# Patient Record
Sex: Female | Born: 1975
Health system: Southern US, Community
[De-identification: ages and names within clinical notes are randomized; demographics above are authoritative.]

## PROBLEM LIST (undated history)

## (undated) DIAGNOSIS — I10 Essential (primary) hypertension: Secondary | ICD-10-CM

## (undated) HISTORY — DX: Essential (primary) hypertension: I10

## (undated) HISTORY — PX: BACK SURGERY: SHX140

---

## 2005-03-23 ENCOUNTER — Emergency Department (HOSPITAL_COMMUNITY): Admission: EM | Admit: 2005-03-23 | Discharge: 2005-03-23 | Payer: Self-pay | Admitting: Emergency Medicine

## 2010-10-14 ENCOUNTER — Emergency Department (HOSPITAL_COMMUNITY): Admission: EM | Admit: 2010-10-14 | Discharge: 2010-10-14 | Payer: Self-pay | Admitting: Emergency Medicine

## 2011-02-27 LAB — CBC
HCT: 35.5 % — ABNORMAL LOW (ref 36.0–46.0)
MCH: 29.3 pg (ref 26.0–34.0)
MCV: 91.3 fL (ref 78.0–100.0)
RBC: 3.89 MIL/uL (ref 3.87–5.11)
WBC: 5.5 10*3/uL (ref 4.0–10.5)

## 2011-02-27 LAB — DIFFERENTIAL
Eosinophils Relative: 1 % (ref 0–5)
Lymphocytes Relative: 34 % (ref 12–46)
Lymphs Abs: 1.9 10*3/uL (ref 0.7–4.0)
Monocytes Absolute: 0.4 10*3/uL (ref 0.1–1.0)
Monocytes Relative: 8 % (ref 3–12)

## 2011-02-27 LAB — URINALYSIS, ROUTINE W REFLEX MICROSCOPIC
Bilirubin Urine: NEGATIVE
Glucose, UA: NEGATIVE mg/dL
Hgb urine dipstick: NEGATIVE
Protein, ur: NEGATIVE mg/dL
Specific Gravity, Urine: 1.027 (ref 1.005–1.030)

## 2011-02-27 LAB — BASIC METABOLIC PANEL
CO2: 27 mEq/L (ref 19–32)
Chloride: 104 mEq/L (ref 96–112)
GFR calc Af Amer: >60 mL/min (ref >60)
Potassium: 4 mEq/L (ref 3.5–5.1)

## 2011-02-27 LAB — PREGNANCY, URINE: Preg Test, Ur: NEGATIVE

## 2011-08-09 ENCOUNTER — Inpatient Hospital Stay (INDEPENDENT_AMBULATORY_CARE_PROVIDER_SITE_OTHER)
Admission: RE | Admit: 2011-08-09 | Discharge: 2011-08-09 | Disposition: A | Discharge status: Home / Self Care | Payer: Self-pay | Source: Ambulatory Visit | Attending: Emergency Medicine | Admitting: Emergency Medicine

## 2011-08-09 DIAGNOSIS — L03019 Cellulitis of unspecified finger: Secondary | ICD-10-CM

## 2011-08-12 LAB — CULTURE, ROUTINE-ABSCESS

## 2012-03-28 ENCOUNTER — Emergency Department (HOSPITAL_COMMUNITY)
Admission: EM | Admit: 2012-03-28 | Discharge: 2012-03-28 | Disposition: A | Discharge status: Home / Self Care | Payer: Self-pay | Attending: Emergency Medicine | Admitting: Emergency Medicine

## 2012-03-28 ENCOUNTER — Emergency Department (HOSPITAL_COMMUNITY): Payer: Self-pay

## 2012-03-28 DIAGNOSIS — H6690 Otitis media, unspecified, unspecified ear: Secondary | ICD-10-CM | POA: Insufficient documentation

## 2012-03-28 DIAGNOSIS — R51 Headache: Secondary | ICD-10-CM | POA: Insufficient documentation

## 2012-03-28 DIAGNOSIS — J329 Chronic sinusitis, unspecified: Secondary | ICD-10-CM | POA: Insufficient documentation

## 2012-03-28 MED ORDER — AMOXICILLIN-POT CLAVULANATE 875-125 MG PO TABS: 1.0000 | ORAL_TABLET | Freq: Two times a day (BID) | ORAL | Status: AC

## 2012-03-28 NOTE — ED Notes (Signed)
NAD noted at this time

## 2012-03-28 NOTE — ED Notes (Signed)
Patient advises that she has had nasal congestion and a productive cough with green sputum. Headache, Sore throat and a low grad fever for 1 week.

## 2012-03-28 NOTE — Discharge Instructions (Signed)
Otitis Media, Adult  A middle ear infection is an infection in the space behind the eardrum. The medical name for this is "otitis media." It may happen after a common cold. It is caused by a germ that starts growing in that space. You may feel swollen glands in your neck on the side of the ear infection.  HOME CARE INSTRUCTIONS   · Take your medicine as directed until it is gone, even if you feel better after the first few days.  · Only take over-the-counter or prescription medicines for pain, discomfort, or fever as directed by your caregiver.  · Occasional use of a nasal decongestant a couple times per day may help with discomfort and help the eustachian tube to drain better.  Follow up with your caregiver in 10 to 14 days or as directed, to be certain that the infection has cleared. Not keeping the appointment could result in a chronic or permanent injury, pain, hearing loss and disability. If there is any problem keeping the appointment, you must call back to this facility for assistance.  SEEK IMMEDIATE MEDICAL CARE IF:   · You are not getting better in 2 to 3 days.  · You have pain that is not controlled with medication.  · You feel worse instead of better.  · You cannot use the medication as directed.  · You develop swelling, redness or pain around the ear or stiffness in your neck.  MAKE SURE YOU:   · Understand these instructions.  · Will watch your condition.  · Will get help right away if you are not doing well or get worse.  Document Released: 09/06/2004 Document Revised: 11/21/2011 Document Reviewed: 07/08/2008  ExitCare® Patient Information ©2012 [employer].

## 2012-03-28 NOTE — ED Provider Notes (Signed)
History     CSN: [id number]  Arrival date & time 03/28/12  6578   First MD Initiated Contact with Patient 03/28/12 [phone]      Chief Complaint  Patient presents with  . Nasal Congestion  . Sore Throat  . Headache    (Consider location/radiation/quality/duration/timing/severity/associated sxs/prior treatment) HPI Comments: Patient is with chief complaint of upper respiratory type symptoms.  Symptoms began one week ago and include headaches, nasal congestion, productive cough with green sputum, sore throat, and right ear pain.  Symptoms have been gradually worsening.  Patient denies any change in vision, difficulty swallowing, shortness of breath, chest pain, chest tightness, ataxia, disequilibrium, or weakness.  Patient has no other complaints at this time.  Patient is a 36 y.o. female presenting with pharyngitis and headaches. The history is provided by the patient.  Sore Throat Associated symptoms include chills, congestion, coughing, a fever and headaches. Pertinent negatives include no abdominal pain, chest pain, fatigue, myalgias, nausea, numbness, sore throat, vomiting or weakness.  Headache  Associated symptoms include a fever. Pertinent negatives include no palpitations, no shortness of breath, no nausea and no vomiting.    No past medical history on file.  No past surgical history on file.  No family history on file.  History  Substance Use Topics  . Smoking status: Not on file  . Smokeless tobacco: Not on file  . Alcohol Use: Not on file    OB History    No data available      Review of Systems  Constitutional: Positive for fever and chills. Negative for appetite change and fatigue.  HENT: Positive for congestion and rhinorrhea. Negative for hearing loss, ear pain, nosebleeds, sore throat, sneezing, trouble swallowing, neck stiffness, voice change, postnasal drip, sinus pressure, tinnitus and ear discharge.   Eyes: Negative for photophobia and visual disturbance.   Respiratory: Positive for cough. Negative for apnea, choking, chest tightness, shortness of breath, wheezing and stridor.   Cardiovascular: Negative for chest pain, palpitations and leg swelling.  Gastrointestinal: Negative for nausea, vomiting, abdominal pain, diarrhea and constipation.  Genitourinary: Negative for dysuria, urgency and flank pain.  Musculoskeletal: Negative for myalgias.  Neurological: Positive for headaches. Negative for dizziness, seizures, syncope, weakness, light-headedness and numbness.  Psychiatric/Behavioral: Negative for behavioral problems and confusion.  All other systems reviewed and are negative.    Allergies  Review of patient's allergies indicates no known allergies.  Home Medications   Current Outpatient Rx  Name Route Sig Dispense Refill  . CETIRIZINE-PSEUDOEPHEDRINE ER 5-120 MG PO TB12 Oral Take 1 tablet by mouth 2 (two) times daily as needed. For allergies    . IBUPROFEN 200 MG PO TABS Oral Take 600 mg by mouth every 8 (eight) hours as needed. For pain    . ALKA-SELTZER PLS ALLERGY & [facility] PO Oral Take 1 tablet by mouth 2 (two) times daily as needed. For colds/allergies      BP 147/88  Pulse 91  Temp(Src) 98 F (36.7 C) (Oral)  Resp 20  SpO2 100%  Physical Exam  Constitutional: She is oriented to person, place, and time. She appears well-developed and well-nourished. No distress.  HENT:  Head: Normocephalic and atraumatic.  Mouth/Throat: Oropharynx is clear and moist. No oropharyngeal exudate.       ttp over frontal sinus, no tragal tenderness, left ear normal, right ear canal normal but TM bulging. No Mastoid tenderness or erythema. Uvula midline, airway intact.   Eyes: Conjunctivae and EOM are normal. Pupils are equal, round, and  reactive to light. No scleral icterus.  Neck: Normal range of motion. Neck supple. No tracheal deviation present. No thyromegaly present.  Cardiovascular: Normal rate, regular rhythm, normal heart sounds and intact  distal pulses.   Pulmonary/Chest: Effort normal and breath sounds normal. No stridor. No respiratory distress. She has no wheezes.  Abdominal: Soft.  Musculoskeletal: Normal range of motion. She exhibits no edema and no tenderness.  Neurological: She is alert and oriented to person, place, and time. Coordination normal.  Skin: Skin is warm and dry. No rash noted. She is not diaphoretic. No erythema. No pallor.  Psychiatric: She has a normal mood and affect. Her behavior is normal.    ED Course  Procedures (including critical care time)  Labs Reviewed - No data to display No results found.   No diagnosis found.    MDM  Otitis media, right. Etiology likely d/t sinus infection. Will be treated with abx. Radiology reviewed, no evidence of PNA. Verbalizes understanding and is agreeable with plan. Pt is hemodynamically stable & in NAD prior to dc.         Jaci Carrel, [place] 03/28/12 1036

## 2012-03-29 ENCOUNTER — Encounter (HOSPITAL_COMMUNITY): Payer: Self-pay | Admitting: *Deleted

## 2012-03-31 NOTE — ED Provider Notes (Signed)
Medical screening examination/treatment/procedure(s) were performed by non-physician practitioner and as supervising physician I was immediately available for consultation/collaboration.   Cyndra Numbers, MD 03/31/12 [phone]

## 2013-09-04 ENCOUNTER — Emergency Department (HOSPITAL_COMMUNITY)
Admission: EM | Admit: 2013-09-04 | Discharge: 2013-09-04 | Disposition: A | Discharge status: Home / Self Care | Payer: BC Managed Care – PPO | Source: Home / Self Care | Attending: Family Medicine | Admitting: Family Medicine

## 2013-09-04 ENCOUNTER — Encounter (HOSPITAL_COMMUNITY): Payer: Self-pay

## 2013-09-04 DIAGNOSIS — H6122 Impacted cerumen, left ear: Secondary | ICD-10-CM

## 2013-09-04 DIAGNOSIS — H6120 Impacted cerumen, unspecified ear: Secondary | ICD-10-CM

## 2013-09-04 NOTE — ED Notes (Signed)
Left ear pain x 1 week

## 2013-09-04 NOTE — ED Provider Notes (Signed)
CSN: [id number]     Arrival date & time 09/04/13  0906 History   First MD Initiated Contact with Patient 09/04/13 1013     Chief Complaint  Patient presents with  . Otalgia    Left    Patient is a 37 y.o. female presenting with ear pain. The history is provided by the patient.  Otalgia Location:  Left Quality:  Pressure Severity:  Mild Onset quality:  Gradual Duration:  1 week Timing:  Constant Progression:  Worsening Chronicity:  Recurrent Worsened by:  Nothing tried Ineffective treatments:  None tried Pt reports a 1 wk h/o increasing pressure and slight pain to her (L) ear. States her hearing is muffled as well. Denies fever or other URI type sx's. Admits to h/o excessive ear wax production and has had to have her ears irrigated in the past.   History reviewed. No pertinent past medical history. History reviewed. No pertinent past surgical history. No family history on file. History  Substance Use Topics  . Smoking status: Never Smoker   . Smokeless tobacco: Not on file  . Alcohol Use: Yes     Comment: occasionally   OB History   Grav Para Term Preterm Abortions TAB SAB Ect Mult Living                 Review of Systems  HENT: Positive for ear pain.   All other systems reviewed and are negative.    Allergies  Review of patient's allergies indicates no known allergies.  Home Medications   Current Outpatient Rx  Name  Route  Sig  Dispense  Refill  . cetirizine-pseudoephedrine (ZYRTEC-D) 5-120 MG per tablet   Oral   Take 1 tablet by mouth 2 (two) times daily as needed. For allergies         . ibuprofen (ADVIL,MOTRIN) 200 MG tablet   Oral   Take 600 mg by mouth every 8 (eight) hours as needed. For pain          BP 147/87  Pulse 90  Temp(Src) 98.7 F (37.1 C) (Oral)  Resp 18  SpO2 98%  LMP 08/16/2013 Physical Exam  Constitutional: She is oriented to person, place, and time. She appears well-developed and well-nourished.  HENT:  Head: Normocephalic  and atraumatic.  Right Ear: Tympanic membrane, external ear and ear canal normal.  Cerumen impaction (L) ear  Eyes: Conjunctivae are normal.  Neck: Neck supple.  Cardiovascular: Normal rate.   Musculoskeletal: Normal range of motion.  Neurological: She is alert and oriented to person, place, and time.  Skin: Skin is warm and dry.  Psychiatric: She has a normal mood and affect.    ED Course  Procedures (including critical care time) Labs Review Labs Reviewed - No data to display Imaging Review No results found.  MDM  No diagnosis found.  1 week h/o cerumen impaction. Resolved w/ irrigation. Symptoms improved. Strategies for avoiding cerumen impactions discussed.     Leanne Chang, NP 09/04/13 1100

## 2013-09-08 NOTE — ED Provider Notes (Signed)
Medical screening examination/treatment/procedure(s) were performed by resident physician or non-physician practitioner and as supervising physician I was immediately available for consultation/collaboration.   Mischa Brittingham DOUGLAS MD.   Champayne Kocian D Carmela Piechowski, MD 09/08/13 1510 

## 2015-04-04 ENCOUNTER — Other Ambulatory Visit: Payer: Self-pay | Admitting: Physical Medicine and Rehabilitation

## 2015-04-04 DIAGNOSIS — M545 Low back pain: Secondary | ICD-10-CM

## 2015-04-22 ENCOUNTER — Ambulatory Visit
Admission: RE | Admit: 2015-04-22 | Discharge: 2015-04-22 | Disposition: A | Discharge status: Home / Self Care | Payer: BLUE CROSS/BLUE SHIELD | Source: Ambulatory Visit | Attending: Physical Medicine and Rehabilitation | Admitting: Physical Medicine and Rehabilitation

## 2015-04-22 DIAGNOSIS — M545 Low back pain: Secondary | ICD-10-CM

## 2015-05-22 ENCOUNTER — Encounter (HOSPITAL_COMMUNITY): Payer: Self-pay | Admitting: Family Medicine

## 2015-05-22 ENCOUNTER — Emergency Department (HOSPITAL_COMMUNITY)
Admission: EM | Admit: 2015-05-22 | Discharge: 2015-05-22 | Disposition: A | Discharge status: Home / Self Care | Payer: BLUE CROSS/BLUE SHIELD | Attending: Emergency Medicine | Admitting: Emergency Medicine

## 2015-05-22 DIAGNOSIS — H748X1 Other specified disorders of right middle ear and mastoid: Secondary | ICD-10-CM | POA: Insufficient documentation

## 2015-05-22 DIAGNOSIS — H9191 Unspecified hearing loss, right ear: Secondary | ICD-10-CM | POA: Insufficient documentation

## 2015-05-22 DIAGNOSIS — H9201 Otalgia, right ear: Secondary | ICD-10-CM | POA: Diagnosis not present

## 2015-05-22 MED ORDER — ANTIPYRINE-BENZOCAINE 5.4-1.4 % OT SOLN: 3.0000 [drp] | OTIC | Status: DC | PRN

## 2015-05-22 NOTE — Discharge Instructions (Signed)
Otalgia  The most common reason for this in children is an infection of the middle ear. Pain from the middle ear is usually caused by a build-up of fluid and pressure behind the eardrum. Pain from an earache can be sharp, dull, or burning. The pain may be temporary or constant. The middle ear is connected to the nasal passages by a short narrow tube called the Eustachian tube. The Eustachian tube allows fluid to drain out of the middle ear, and helps keep the pressure in your ear equalized.  CAUSES   A cold or allergy can block the Eustachian tube with inflammation and the build-up of secretions. This is especially likely in small children, because their Eustachian tube is shorter and more horizontal. When the Eustachian tube closes, the normal flow of fluid from the middle ear is stopped. Fluid can accumulate and cause stuffiness, pain, hearing loss, and an ear infection if germs start growing in this area.  SYMPTOMS   The symptoms of an ear infection may include fever, ear pain, fussiness, increased crying, and irritability. Many children will have temporary and minor hearing loss during and right after an ear infection. Permanent hearing loss is rare, but the risk increases the more infections a child has. Other causes of ear pain include retained water in the outer ear canal from swimming and bathing.  Ear pain in adults is less likely to be from an ear infection. Ear pain may be referred from other locations. Referred pain may be from the joint between your jaw and the skull. It may also come from a tooth problem or problems in the neck. Other causes of ear pain include:   A foreign body in the ear.   Outer ear infection.   Sinus infections.   Impacted ear wax.   Ear injury.   Arthritis of the jaw or TMJ problems.   Middle ear infection.   Tooth infections.   Sore throat with pain to the ears.  DIAGNOSIS   Your caregiver can usually make the diagnosis by examining you. Sometimes other special studies,  including x-rays and lab work may be necessary.  TREATMENT    If antibiotics were prescribed, use them as directed and finish them even if you or your child's symptoms seem to be improved.   Sometimes PE tubes are needed in children. These are little plastic tubes which are put into the eardrum during a simple surgical procedure. They allow fluid to drain easier and allow the pressure in the middle ear to equalize. This helps relieve the ear pain caused by pressure changes.  HOME CARE INSTRUCTIONS    Only take over-the-counter or prescription medicines for pain, discomfort, or fever as directed by your caregiver. DO NOT GIVE CHILDREN ASPIRIN because of the association of Reye's Syndrome in children taking aspirin.   Use a cold pack applied to the outer ear for 15-20 minutes, 03-04 times per day or as needed may reduce pain. Do not apply ice directly to the skin. You may cause frost bite.   Over-the-counter ear drops used as directed may be effective. Your caregiver may sometimes prescribe ear drops.   Resting in an upright position may help reduce pressure in the middle ear and relieve pain.   Ear pain caused by rapidly descending from high altitudes can be relieved by swallowing or chewing gum. Allowing infants to suck on a bottle during airplane travel can help.   Do not smoke in the house or near children. If you are   unable to quit smoking, smoke outside.   Control allergies.  SEEK IMMEDIATE MEDICAL CARE IF:    You or your child are becoming sicker.   Pain or fever relief is not obtained with medicine.   You or your child's symptoms (pain, fever, or irritability) do not improve within 24 to 48 hours or as instructed.   Severe pain suddenly stops hurting. This may indicate a ruptured eardrum.   You or your children develop new problems such as severe headaches, stiff neck, difficulty swallowing, or swelling of the face or around the ear.  Document Released: 07/19/2004 Document Revised: 02/24/2012  Document Reviewed: 11/23/2008  ExitCare Patient Information 2015 [employer]. This information is not intended to replace advice given to you by your health care provider. Make sure you discuss any questions you have with your health care provider.

## 2015-05-22 NOTE — ED Provider Notes (Signed)
CSN: [id number]     Arrival date & time 05/22/15  1045 History  This chart was scribed for non-physician practitioner, Montine Circle, PA-C working with Quintella Reichert, MD by Rayna Sexton, ED scribe. This patient was seen in room TR11C/TR11C and the patient's care was started at 11:37 AM.    Chief Complaint  Patient presents with  . Otalgia    The history is provided by the patient. No language interpreter was used.    HPI Comments: Amy Lamb is a 39 y.o. female who presents to the Emergency Department complaining of constant, mild, right ear pain with onset on 05/13/2015. Pt notes originally going to Urgent Care for her symptoms and being prescribed amoxicillin and pain medication with no relief of symptoms. Pt also notes taking additional OTC ear and allergy medications with no relief. She notes that the original physician recommended that with no relief of symptoms she should go see an ENT specialist but was unable to get a timely appointment. Pt also complains of intermittent ringing and hearing loss in the affected ear. Pt denies history of DM. She additionally denies fever.     History reviewed. No pertinent past medical history. History reviewed. No pertinent past surgical history. History reviewed. No pertinent family history. History  Substance Use Topics  . Smoking status: Never Smoker   . Smokeless tobacco: Not on file  . Alcohol Use: Yes     Comment: occasionally   OB History    No data available     Review of Systems  Constitutional: Negative for fever.  HENT: Positive for ear pain and hearing loss. Negative for ear discharge.       Allergies  Review of patient's allergies indicates no known allergies.  Home Medications   Prior to Admission medications   Medication Sig Start Date End Date Taking? Authorizing Provider  cetirizine-pseudoephedrine (ZYRTEC-D) 5-120 MG per tablet Take 1 tablet by mouth 2 (two) times daily as needed. For allergies     Historical Provider, MD  ibuprofen (ADVIL,MOTRIN) 200 MG tablet Take 600 mg by mouth every 8 (eight) hours as needed. For pain    Historical Provider, MD   Pulse 78  Temp(Src) 99 F (37.2 C) (Oral)  Resp 18  SpO2 97%  LMP 05/13/2015 Physical Exam  Constitutional: She is oriented to person, place, and time. She appears well-developed and well-nourished. No distress.  HENT:  Head: Normocephalic and atraumatic.  Mouth/Throat: Oropharynx is clear and moist.  Right tympanic membrane is moderately erythematous, with effusion  Eyes: Conjunctivae and EOM are normal. Pupils are equal, round, and reactive to light.  Neck: Normal range of motion. Neck supple. No tracheal deviation present.  Cardiovascular: Normal rate.   Pulmonary/Chest: Breath sounds normal. No respiratory distress.  Abdominal: Soft.  Musculoskeletal: Normal range of motion.  Neurological: She is alert and oriented to person, place, and time.  Skin: Skin is warm and dry.  Psychiatric: She has a normal mood and affect. Her behavior is normal.  Nursing note and vitals reviewed.   ED Course  Procedures  DIAGNOSTIC STUDIES: Oxygen Saturation is 97% on RA, normal by my interpretation.    COORDINATION OF CARE: 11:45 AM Discussed treatment plan with pt at bedside and pt agreed to plan.  Labs Review Labs Reviewed - No data to display  Imaging Review No results found.   EKG Interpretation None      MDM   Final diagnoses:  Otalgia, right    Patient with right-sided otalgia, currently  being treated for otitis media. Presents inquiring about myringotomy. I informed patient of this finding be done by an ENT. Recommend outpatient follow-up. Continue antibiotic as prescribed.  I personally performed the services described in this documentation, which was scribed in my presence. The recorded information has been reviewed and is accurate.     Montine Circle, PA-C 05/22/15 [place], MD 05/22/15 [phone]

## 2015-05-22 NOTE — ED Notes (Signed)
Pt started taking Amox-clav 875-125 mg on 05/13/15. Pt new meds started on Friday 05-19-15. azithromax . Pt stopped the Amox-cla on Friday.

## 2015-05-22 NOTE — ED Notes (Signed)
Pt here for right ear pain. sts she has been taking abx and was referred to ENT but couldn't get appt today. sts severe pain.

## 2016-08-26 DIAGNOSIS — M5416 Radiculopathy, lumbar region: Secondary | ICD-10-CM | POA: Diagnosis not present

## 2017-03-26 DIAGNOSIS — Z79899 Other long term (current) drug therapy: Secondary | ICD-10-CM | POA: Diagnosis not present

## 2017-03-26 DIAGNOSIS — R9431 Abnormal electrocardiogram [ECG] [EKG]: Secondary | ICD-10-CM | POA: Diagnosis not present

## 2017-03-26 DIAGNOSIS — I10 Essential (primary) hypertension: Secondary | ICD-10-CM | POA: Diagnosis not present

## 2017-03-26 DIAGNOSIS — R079 Chest pain, unspecified: Secondary | ICD-10-CM | POA: Diagnosis not present

## 2017-04-09 DIAGNOSIS — I10 Essential (primary) hypertension: Secondary | ICD-10-CM | POA: Diagnosis not present

## 2017-04-09 DIAGNOSIS — R9431 Abnormal electrocardiogram [ECG] [EKG]: Secondary | ICD-10-CM | POA: Diagnosis not present

## 2017-04-09 DIAGNOSIS — R0602 Shortness of breath: Secondary | ICD-10-CM | POA: Diagnosis not present

## 2017-04-09 DIAGNOSIS — R079 Chest pain, unspecified: Secondary | ICD-10-CM | POA: Diagnosis not present

## 2017-05-05 DIAGNOSIS — M5416 Radiculopathy, lumbar region: Secondary | ICD-10-CM | POA: Diagnosis not present

## 2017-10-01 DIAGNOSIS — I10 Essential (primary) hypertension: Secondary | ICD-10-CM | POA: Diagnosis not present

## 2018-01-22 DIAGNOSIS — M5126 Other intervertebral disc displacement, lumbar region: Secondary | ICD-10-CM | POA: Diagnosis not present

## 2018-01-22 DIAGNOSIS — M5416 Radiculopathy, lumbar region: Secondary | ICD-10-CM | POA: Diagnosis not present

## 2018-01-28 ENCOUNTER — Other Ambulatory Visit: Payer: Self-pay | Admitting: Rehabilitation

## 2018-01-28 DIAGNOSIS — M5416 Radiculopathy, lumbar region: Secondary | ICD-10-CM

## 2018-01-31 ENCOUNTER — Ambulatory Visit
Admission: RE | Admit: 2018-01-31 | Discharge: 2018-01-31 | Disposition: A | Discharge status: Home / Self Care | Payer: BLUE CROSS/BLUE SHIELD | Source: Ambulatory Visit | Attending: Rehabilitation | Admitting: Rehabilitation

## 2018-01-31 DIAGNOSIS — M5416 Radiculopathy, lumbar region: Secondary | ICD-10-CM

## 2018-01-31 DIAGNOSIS — M48061 Spinal stenosis, lumbar region without neurogenic claudication: Secondary | ICD-10-CM | POA: Diagnosis not present

## 2018-02-19 DIAGNOSIS — M545 Low back pain: Secondary | ICD-10-CM | POA: Diagnosis not present

## 2018-02-19 DIAGNOSIS — M5126 Other intervertebral disc displacement, lumbar region: Secondary | ICD-10-CM | POA: Diagnosis not present

## 2018-02-19 DIAGNOSIS — M5416 Radiculopathy, lumbar region: Secondary | ICD-10-CM | POA: Diagnosis not present

## 2018-03-05 ENCOUNTER — Encounter: Payer: Self-pay | Admitting: Physician Assistant

## 2018-04-02 DIAGNOSIS — M5416 Radiculopathy, lumbar region: Secondary | ICD-10-CM | POA: Diagnosis not present

## 2018-04-02 DIAGNOSIS — M545 Low back pain: Secondary | ICD-10-CM | POA: Diagnosis not present

## 2018-04-02 DIAGNOSIS — M5126 Other intervertebral disc displacement, lumbar region: Secondary | ICD-10-CM | POA: Diagnosis not present

## 2018-05-15 DIAGNOSIS — M5126 Other intervertebral disc displacement, lumbar region: Secondary | ICD-10-CM | POA: Diagnosis not present

## 2018-05-15 DIAGNOSIS — M545 Low back pain: Secondary | ICD-10-CM | POA: Diagnosis not present

## 2018-06-20 DIAGNOSIS — Z760 Encounter for issue of repeat prescription: Secondary | ICD-10-CM | POA: Diagnosis not present

## 2018-06-20 DIAGNOSIS — I10 Essential (primary) hypertension: Secondary | ICD-10-CM | POA: Diagnosis not present

## 2018-07-08 DIAGNOSIS — M4716 Other spondylosis with myelopathy, lumbar region: Secondary | ICD-10-CM | POA: Diagnosis not present

## 2018-07-08 DIAGNOSIS — M5126 Other intervertebral disc displacement, lumbar region: Secondary | ICD-10-CM | POA: Diagnosis not present

## 2018-07-08 DIAGNOSIS — Z6835 Body mass index (BMI) 35.0-35.9, adult: Secondary | ICD-10-CM | POA: Diagnosis not present

## 2018-07-08 DIAGNOSIS — M545 Low back pain: Secondary | ICD-10-CM | POA: Diagnosis not present

## 2018-08-01 ENCOUNTER — Encounter: Payer: Self-pay | Admitting: Physician Assistant

## 2018-08-01 ENCOUNTER — Ambulatory Visit: Payer: BLUE CROSS/BLUE SHIELD | Admitting: Physician Assistant

## 2018-08-01 ENCOUNTER — Other Ambulatory Visit: Payer: Self-pay

## 2018-08-01 VITALS — BP 114/84 | HR 86 | Temp 99.2°F | Resp 16 | Ht 65.0 in | Wt 206.2 lb

## 2018-08-01 DIAGNOSIS — Z0130 Encounter for examination of blood pressure without abnormal findings: Secondary | ICD-10-CM | POA: Diagnosis not present

## 2018-08-01 DIAGNOSIS — I10 Essential (primary) hypertension: Secondary | ICD-10-CM | POA: Insufficient documentation

## 2018-08-01 DIAGNOSIS — R7303 Prediabetes: Secondary | ICD-10-CM | POA: Insufficient documentation

## 2018-08-01 LAB — POCT URINALYSIS DIP (MANUAL ENTRY)
Bilirubin, UA: NEGATIVE
Blood, UA: NEGATIVE
Glucose, UA: NEGATIVE mg/dL
Ketones, POC UA: NEGATIVE mg/dL
Nitrite, UA: NEGATIVE
Protein Ur, POC: NEGATIVE mg/dL
Spec Grav, UA: 1.02 (ref 1.010–1.025)
Urobilinogen, UA: 1 E.U./dL
pH, UA: 7 (ref 5.0–8.0)

## 2018-08-01 MED ORDER — ENALAPRIL-HYDROCHLOROTHIAZIDE 5-12.5 MG PO TABS: 1.0000 | ORAL_TABLET | Freq: Every day | ORAL | 2 refills | Status: DC

## 2018-08-01 NOTE — Progress Notes (Signed)
Amy Lamb  MRN: [id number] DOB: November 26, 1976  PCP: Patient, No Pcp Per  Subjective:  Pt is a pleasant 42 year old female who  has a past medical history of Hypertension. who presents to clinic for medication refill of enalapril/hydrochlorothiazide 5-12.5 mg.  She is a new patient here. Was receiving care at [facility] x several years, her provider left.  She is feeling well today.  Blood pressure today is 114/84. Took last dose of medication this morning.  Never Smoker Denies shortness of breath, dyspnea on exertion, chest pain, palpitations, headache, lightheadedness  She brings with her today results from her Biometric screening for work 03/05/18: HDL: 71 LDL: 151 Total Cholesterol: 234 Glucose: 81   BUN: 12 Crea: 0.74  A1C: 5.7   TSH: 0.694  Not utd PAP.   Review of Systems  Constitutional: Negative for chills, diaphoresis, fatigue and fever.  Respiratory: Negative for cough, chest tightness and shortness of breath.   Cardiovascular: Negative for chest pain, palpitations and leg swelling.    There are no active problems to display for this patient.   Current Outpatient Medications on File Prior to Visit  Medication Sig Dispense Refill  . celecoxib (CELEBREX) 200 MG capsule Take 200 mg by mouth 2 (two) times daily.    . Enalapril-hydroCHLOROthiazide 5-12.5 MG tablet Take 1 tablet by mouth daily.     No current facility-administered medications on file prior to visit.     No Known Allergies   Objective:  BP 114/84 (BP Location: Left Arm, Patient Position: Sitting, Cuff Size: Large)   Pulse 86   Temp 99.2 F (37.3 C) (Oral)   Resp 16   Ht 5\' 5"  (1.651 m)   Wt 206 lb 3.2 oz (93.5 kg)   LMP 07/14/2018   SpO2 98%   BMI 34.31 kg/m   Physical Exam  Constitutional: She is oriented to person, place, and time. No distress.  Cardiovascular: Normal rate, regular rhythm and normal heart sounds.  Neurological: She is alert and oriented to person, place, and time.   Skin: Skin is warm and dry.  Psychiatric: Judgment normal.  Vitals reviewed.  Results for orders placed or performed in visit on 08/01/18  POCT urinalysis dipstick  Result Value Ref Range   Color, UA yellow yellow   Clarity, UA clear clear   Glucose, UA negative negative mg/dL   Bilirubin, UA negative negative   Ketones, POC UA negative negative mg/dL   Spec Grav, UA 1.020 1.010 - 1.025   Blood, UA negative negative   pH, UA 7.0 5.0 - 8.0   Protein Ur, POC negative negative mg/dL   Urobilinogen, UA 1.0 0.2 or 1.0 E.U./dL   Nitrite, UA Negative Negative   Leukocytes, UA Small (1+) (A) Negative    Assessment and Plan :  1. Essential hypertension -Patient is a pleasant 42 year old female who presents for hypertension.  She is a new patient here.  Blood pressure controlled with enalapril/hydrochlorothiazide 5-12.5 mg, blood pressure today is 114/84.  She is asymptomatic.  Non-smoker.  Recent biometric lab results scanned into her chart.  Return to clinic in 6 months for annual exam including Pap. - Enalapril-hydroCHLOROthiazide 5-12.5 MG tablet; Take 1 tablet by mouth daily.  Dispense: 90 tablet; Refill: 2  3. Prediabetes -Biometric screening labs indicate prediabetes.  Discussed this with patient as well as diabetes prevention lifestyle measures.  She understands.  Will recheck this in 6 months at her annual exam.  Mercer Pod, PA-C  Primary [facility] at [facility]  [facility] 08/01/2018 2:05 PM  Please note: Portions of this report may have been transcribed using dragon voice recognition software. Every effort was made to ensure accuracy; however, inadvertent computerized transcription errors may be present.

## 2018-08-01 NOTE — Patient Instructions (Addendum)
The American Heart Association is a great resource to help you lower cholesterol levels and how to eat a heart healthy diet. Eating heart healthy can lower your cholesterol as well as blood sugar.    I recommend a fish oil/omega-3 supplements daily as well as focusing on low cholesterol diet and exercise. It is important to take an omega-3/fish oil supplement that is highest in the [place] and EPA types of omega-3 so look for those under the active ingredients and pick the one with the most during the next time you purchase supplements. Sometimes the the store brands will actually end up being the best for the least cost. Do not pay attention to the total mg listed on the front of the bottle.  You may also want to consider adding in flax seed, niacin, and/or red yeast rice and coenzyme q10 to help lower your cholesterol.   Come back in 6 months for your annual exam and PAP screening.   Prediabetes Eating Plan Prediabetes-also called impaired glucose tolerance or impaired fasting glucose-is a condition that causes blood sugar (blood glucose) levels to be higher than normal. Following a healthy diet can help to keep prediabetes under control. It can also help to lower the risk of type 2 diabetes and heart disease, which are increased in people who have prediabetes. Along with regular exercise, a healthy diet:  Promotes weight loss.  Helps to control blood sugar levels.  Helps to improve the way that the body uses insulin.  What do I need to know about this eating plan?  Use the glycemic index (GI) to plan your meals. The index tells you how quickly a food will raise your blood sugar. Choose low-GI foods. These foods take a longer time to raise blood sugar.  Pay close attention to the amount of carbohydrates in the food that you eat. Carbohydrates increase blood sugar levels.  Keep track of how many calories you take in. Eating the right amount of calories will help you to achieve a healthy weight.  Losing about 7 percent of your starting weight can help to prevent type 2 diabetes.  You may want to follow a Mediterranean diet. This diet includes a lot of vegetables, lean meats or fish, whole grains, fruits, and healthy oils and fats. What foods can I eat? Grains Whole grains, such as whole-wheat or whole-grain breads, crackers, cereals, and pasta. Unsweetened oatmeal. Bulgur. Barley. Quinoa. Brown rice. Corn or whole-wheat flour tortillas or taco shells. Vegetables Lettuce. Spinach. Peas. Beets. Cauliflower. Cabbage. Broccoli. Carrots. Tomatoes. Squash. Eggplant. Herbs. Peppers. Onions. Cucumbers. Brussels sprouts. Fruits Berries. Bananas. Apples. Oranges. Grapes. Papaya. Mango. Pomegranate. Kiwi. Grapefruit. Cherries. Meats and Other Protein Sources Seafood. Lean meats, such as chicken and [place] or lean cuts of pork and beef. Tofu. Eggs. Nuts. Beans. Dairy Low-fat or fat-free dairy products, such as yogurt, cottage cheese, and cheese. Beverages Water. Tea. Coffee. Sugar-free or diet soda. Seltzer water. Milk. Milk alternatives, such as soy or almond milk. Condiments Mustard. Relish. Low-fat, low-sugar ketchup. Low-fat, low-sugar barbecue sauce. Low-fat or fat-free mayonnaise. Sweets and Desserts Sugar-free or low-fat pudding. Sugar-free or low-fat ice cream and other frozen treats. Fats and Oils Avocado. Walnuts. Olive oil. The items listed above may not be a complete list of recommended foods or beverages. Contact your dietitian for more options. What foods are not recommended? Grains Refined white flour and flour products, such as bread, pasta, snack foods, and cereals. Beverages Sweetened drinks, such as sweet iced tea and soda. Sweets and  Desserts Baked goods, such as cake, cupcakes, pastries, cookies, and cheesecake. The items listed above may not be a complete list of foods and beverages to avoid. Contact your dietitian for more information. This information is not  intended to replace advice given to you by your health care provider. Make sure you discuss any questions you have with your health care provider. Document Released: 04/18/2015 Document Revised: 05/09/2016 Document Reviewed: 12/28/2014 Elsevier Interactive Patient Education  2017 [place] you for coming in today. I hope you feel we met your needs.  Feel free to call PCP if you have any questions or further requests.  Please consider signing up for MyChart if you do not already have it, as this is a great way to communicate with me.  Best,  Amy McVey, PA-C  IF you received an x-ray today, you will receive an invoice from [facility]. Please contact [facility] at [phone] with questions or concerns regarding your invoice.   IF you received labwork today, you will receive an invoice from [place]. Please contact [facility] at [phone] with questions or concerns regarding your invoice.   Our billing staff will not be able to assist you with questions regarding bills from these companies.  You will be contacted with the lab results as soon as they are available. The fastest way to get your results is to activate your My Chart account. Instructions are located on the last page of this paperwork. If you have not heard from [place] regarding the results in 2 weeks, please contact this office.

## 2018-08-21 DIAGNOSIS — Z4689 Encounter for fitting and adjustment of other specified devices: Secondary | ICD-10-CM | POA: Diagnosis not present

## 2018-08-21 DIAGNOSIS — M5126 Other intervertebral disc displacement, lumbar region: Secondary | ICD-10-CM | POA: Diagnosis not present

## 2018-08-27 DIAGNOSIS — R9431 Abnormal electrocardiogram [ECG] [EKG]: Secondary | ICD-10-CM | POA: Diagnosis not present

## 2018-08-27 DIAGNOSIS — Z01818 Encounter for other preprocedural examination: Secondary | ICD-10-CM | POA: Diagnosis not present

## 2018-09-15 DIAGNOSIS — M5126 Other intervertebral disc displacement, lumbar region: Secondary | ICD-10-CM | POA: Diagnosis not present

## 2018-09-15 DIAGNOSIS — R2689 Other abnormalities of gait and mobility: Secondary | ICD-10-CM | POA: Diagnosis not present

## 2018-09-15 DIAGNOSIS — M4186 Other forms of scoliosis, lumbar region: Secondary | ICD-10-CM | POA: Diagnosis not present

## 2018-09-15 DIAGNOSIS — M48062 Spinal stenosis, lumbar region with neurogenic claudication: Secondary | ICD-10-CM | POA: Diagnosis not present

## 2018-09-15 DIAGNOSIS — M532X6 Spinal instabilities, lumbar region: Secondary | ICD-10-CM | POA: Diagnosis not present

## 2018-09-15 DIAGNOSIS — M435X6 Other recurrent vertebral dislocation, lumbar region: Secondary | ICD-10-CM | POA: Diagnosis not present

## 2018-09-15 DIAGNOSIS — M419 Scoliosis, unspecified: Secondary | ICD-10-CM | POA: Diagnosis not present

## 2018-09-15 DIAGNOSIS — I10 Essential (primary) hypertension: Secondary | ICD-10-CM | POA: Diagnosis not present

## 2018-09-15 DIAGNOSIS — M4716 Other spondylosis with myelopathy, lumbar region: Secondary | ICD-10-CM | POA: Diagnosis not present

## 2018-09-15 DIAGNOSIS — M4150 Other secondary scoliosis, site unspecified: Secondary | ICD-10-CM | POA: Diagnosis not present

## 2018-09-15 DIAGNOSIS — Z981 Arthrodesis status: Secondary | ICD-10-CM | POA: Diagnosis not present

## 2018-09-15 DIAGNOSIS — M47896 Other spondylosis, lumbar region: Secondary | ICD-10-CM | POA: Diagnosis not present

## 2018-09-15 HISTORY — PX: SPINAL FUSION: SHX223

## 2018-09-16 DIAGNOSIS — Z981 Arthrodesis status: Secondary | ICD-10-CM | POA: Diagnosis not present

## 2018-09-16 DIAGNOSIS — R2689 Other abnormalities of gait and mobility: Secondary | ICD-10-CM | POA: Diagnosis not present

## 2018-09-17 DIAGNOSIS — M48061 Spinal stenosis, lumbar region without neurogenic claudication: Secondary | ICD-10-CM | POA: Diagnosis not present

## 2018-09-17 DIAGNOSIS — M419 Scoliosis, unspecified: Secondary | ICD-10-CM | POA: Diagnosis not present

## 2018-09-17 DIAGNOSIS — Z4789 Encounter for other orthopedic aftercare: Secondary | ICD-10-CM | POA: Diagnosis not present

## 2018-09-17 DIAGNOSIS — R2689 Other abnormalities of gait and mobility: Secondary | ICD-10-CM | POA: Diagnosis not present

## 2018-09-17 DIAGNOSIS — Z981 Arthrodesis status: Secondary | ICD-10-CM | POA: Diagnosis not present

## 2018-09-21 DIAGNOSIS — Z981 Arthrodesis status: Secondary | ICD-10-CM | POA: Diagnosis not present

## 2018-09-21 DIAGNOSIS — R2689 Other abnormalities of gait and mobility: Secondary | ICD-10-CM | POA: Diagnosis not present

## 2018-09-21 DIAGNOSIS — Z4789 Encounter for other orthopedic aftercare: Secondary | ICD-10-CM | POA: Diagnosis not present

## 2018-09-21 DIAGNOSIS — M419 Scoliosis, unspecified: Secondary | ICD-10-CM | POA: Diagnosis not present

## 2018-09-21 DIAGNOSIS — M48061 Spinal stenosis, lumbar region without neurogenic claudication: Secondary | ICD-10-CM | POA: Diagnosis not present

## 2018-09-23 DIAGNOSIS — Z4789 Encounter for other orthopedic aftercare: Secondary | ICD-10-CM | POA: Diagnosis not present

## 2018-09-23 DIAGNOSIS — M419 Scoliosis, unspecified: Secondary | ICD-10-CM | POA: Diagnosis not present

## 2018-09-23 DIAGNOSIS — R2689 Other abnormalities of gait and mobility: Secondary | ICD-10-CM | POA: Diagnosis not present

## 2018-09-23 DIAGNOSIS — Z981 Arthrodesis status: Secondary | ICD-10-CM | POA: Diagnosis not present

## 2018-09-23 DIAGNOSIS — M48061 Spinal stenosis, lumbar region without neurogenic claudication: Secondary | ICD-10-CM | POA: Diagnosis not present

## 2018-09-28 DIAGNOSIS — Z4789 Encounter for other orthopedic aftercare: Secondary | ICD-10-CM | POA: Diagnosis not present

## 2018-09-28 DIAGNOSIS — M419 Scoliosis, unspecified: Secondary | ICD-10-CM | POA: Diagnosis not present

## 2018-09-28 DIAGNOSIS — Z981 Arthrodesis status: Secondary | ICD-10-CM | POA: Diagnosis not present

## 2018-09-28 DIAGNOSIS — M48061 Spinal stenosis, lumbar region without neurogenic claudication: Secondary | ICD-10-CM | POA: Diagnosis not present

## 2018-09-28 DIAGNOSIS — R2689 Other abnormalities of gait and mobility: Secondary | ICD-10-CM | POA: Diagnosis not present

## 2018-10-06 DIAGNOSIS — M549 Dorsalgia, unspecified: Secondary | ICD-10-CM | POA: Diagnosis not present

## 2018-10-15 DIAGNOSIS — M4326 Fusion of spine, lumbar region: Secondary | ICD-10-CM | POA: Diagnosis not present

## 2018-10-15 DIAGNOSIS — M545 Low back pain: Secondary | ICD-10-CM | POA: Diagnosis not present

## 2018-10-15 DIAGNOSIS — Z981 Arthrodesis status: Secondary | ICD-10-CM | POA: Diagnosis not present

## 2018-12-02 DIAGNOSIS — M545 Low back pain: Secondary | ICD-10-CM | POA: Diagnosis not present

## 2018-12-25 IMAGING — MR MR LUMBAR SPINE W/O CM
5 series · 48 of 48 positions shown · non-contrast
Comparison: MRI 04/22/2015

CLINICAL DATA: Lumbar radiculopathy.  Left leg pain and numbness

EXAM:
MRI LUMBAR SPINE WITHOUT CONTRAST
TECHNIQUE: Multiplanar, multisequence MR imaging of the lumbar spine was
performed. No intravenous contrast was administered.

[Series 3: tirm sag · sagittal · 4.0mm · 0.55mm/px · 6 of 14 slices shown]
[im 1/14]
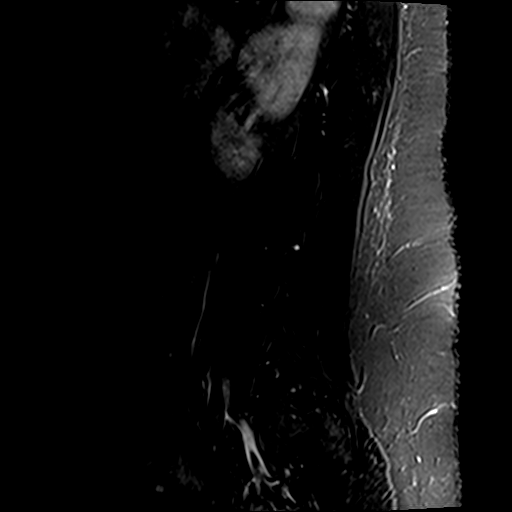
[im 3/14]
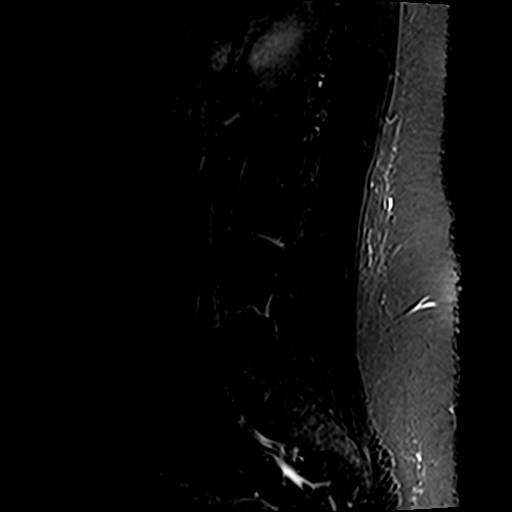
[im 6/14]
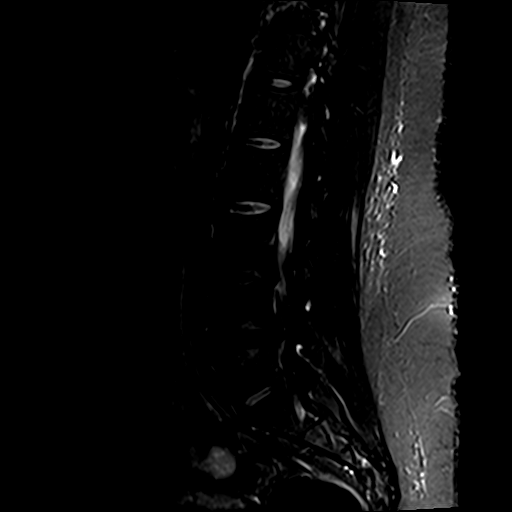
[im 8/14]
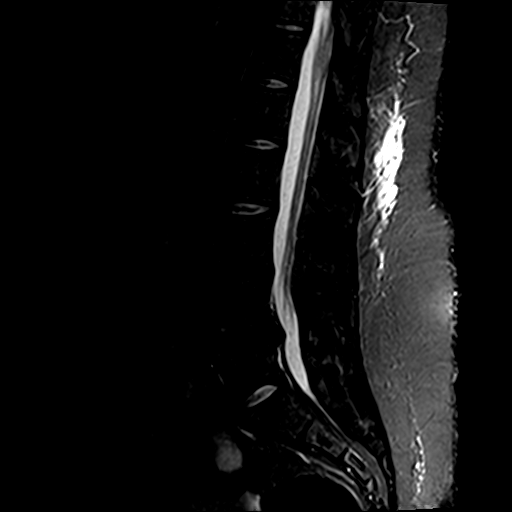
[im 11/14]
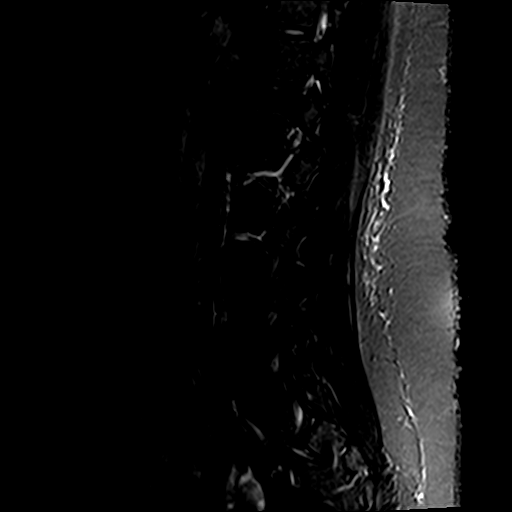
[im 14/14]
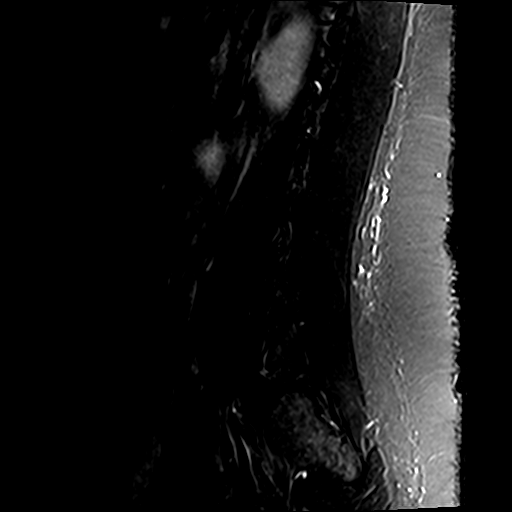

[Series 4: T1 · sagittal · 4.0mm · 1.09mm/px · 6 of 14 slices shown (1 of 2)]
[im 1/14]
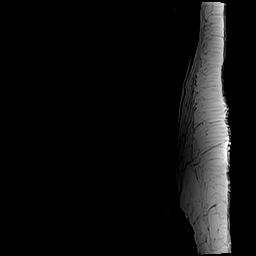
[im 3/14]
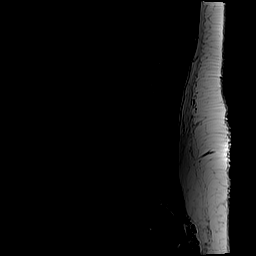
[im 6/14]
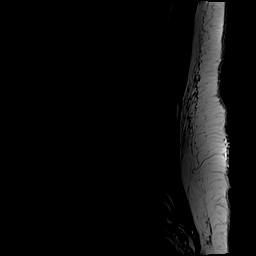
[im 8/14]
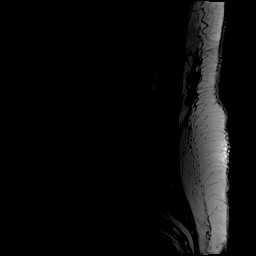
[im 11/14]
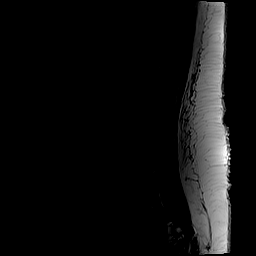
[im 14/14]
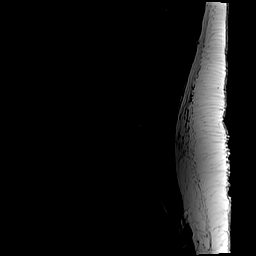

[Series 5: T2 post-contrast · sagittal · 4.0mm · 1.09mm/px · 6 of 14 slices shown]
[im 1/14]
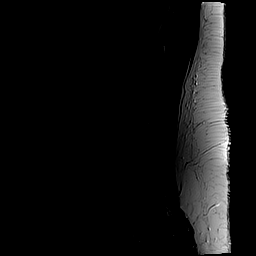
[im 3/14]
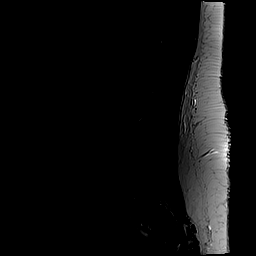
[im 6/14]
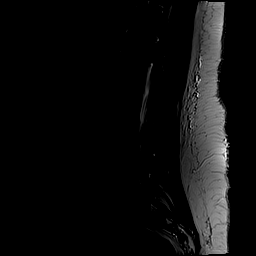
[im 8/14]
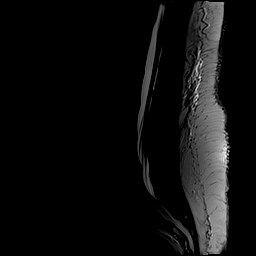
[im 11/14]
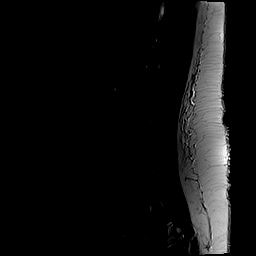
[im 14/14]
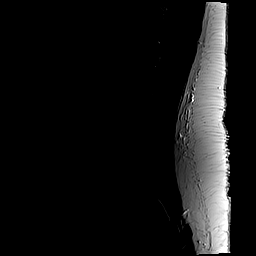

[Series 6: T1 · axial · 4.0mm · 0.78mm/px · z∈[+2,+223]mm · 15 of 36 slices shown (2 of 2)]
[im 1/36]
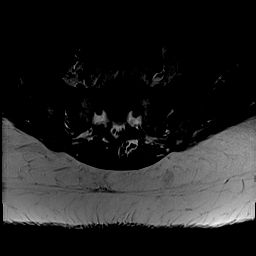
[im 3/36]
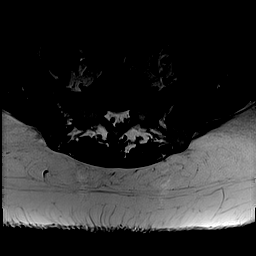
[im 6/36]
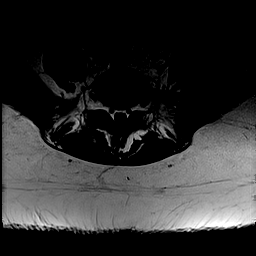
[im 8/36]
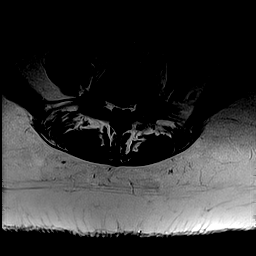
[im 11/36]
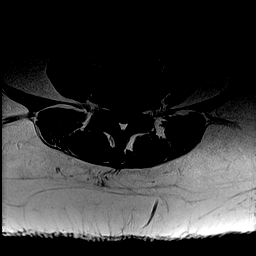
[im 13/36]
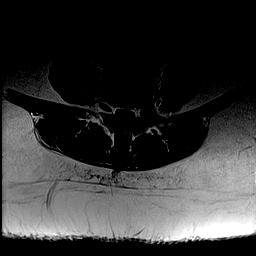
[im 16/36]
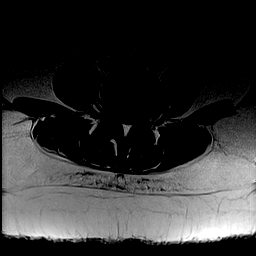
[im 18/36]
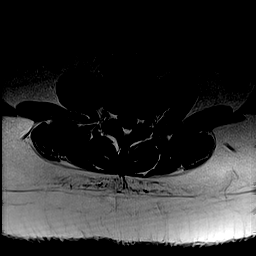
[im 21/36]
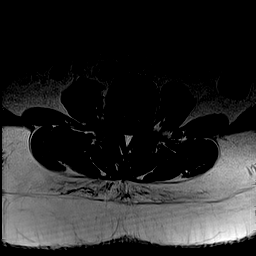
[im 23/36]
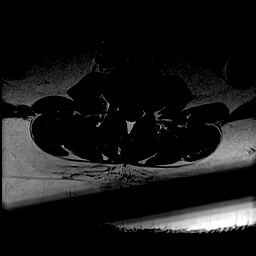
[im 26/36]
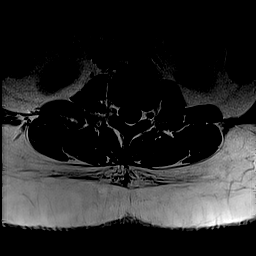
[im 28/36]
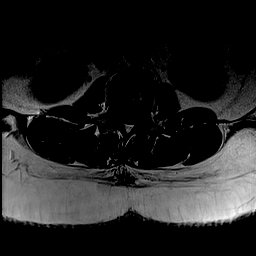
[im 31/36]
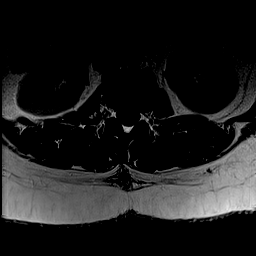
[im 33/36]
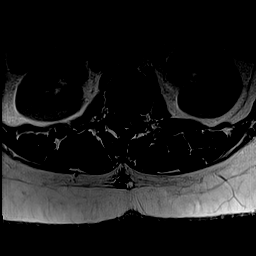
[im 36/36]
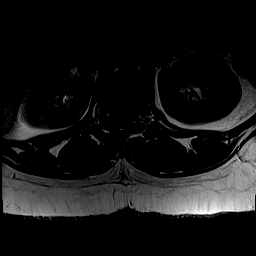

[Series 7: T2 · axial · 4.0mm · 0.78mm/px · z∈[+2,+223]mm · 15 of 36 slices shown]
[im 1/36]
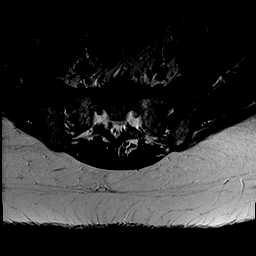
[im 3/36]
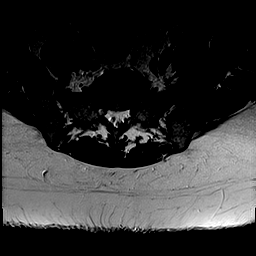
[im 6/36]
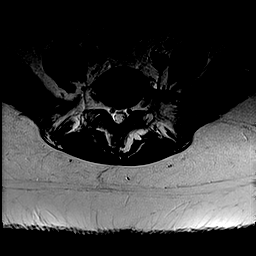
[im 8/36]
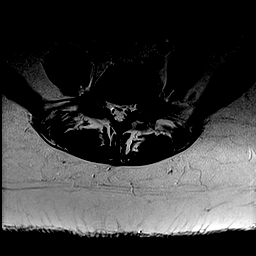
[im 11/36]
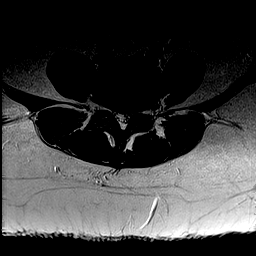
[im 13/36]
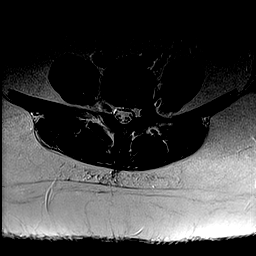
[im 16/36]
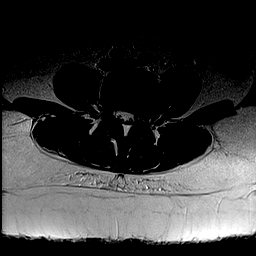
[im 18/36]
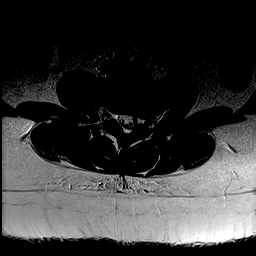
[im 21/36]
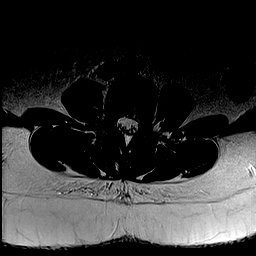
[im 23/36]
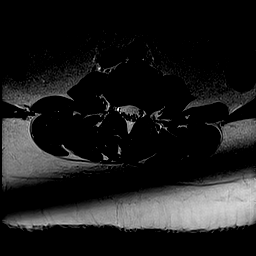
[im 26/36]
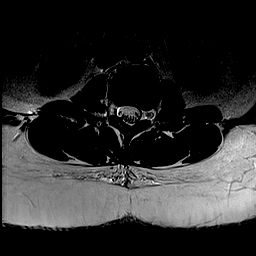
[im 28/36]
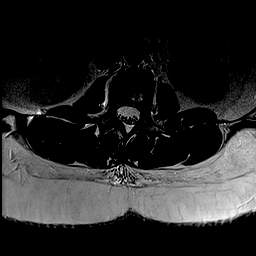
[im 31/36]
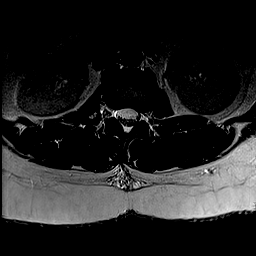
[im 33/36]
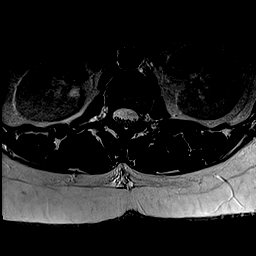
[im 36/36]
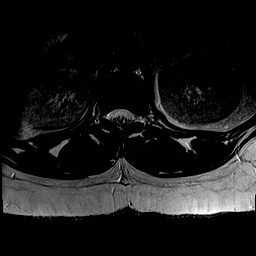

[48 of 48 positions shown; findings below may reference images not displayed]

FINDINGS: Segmentation:  Normal

Alignment:  Normal

Vertebrae: Normal bone marrow. Hemangioma L2 vertebral body
unchanged. Negative for fracture or mass.

Conus medullaris and cauda equina: Conus extends to the T12-L1
level. Conus and cauda equina appear normal.

Paraspinal and other soft tissues: Negative for mass or adenopathy.
Normal retroperitoneum

Disc levels:

L1-2: Negative

L2-3: Small extraforaminal disc protrusion on the left is unchanged.
No significant spinal or foraminal stenosis

L3-4: Disc degeneration and disc bulging. Small extraforaminal disc
protrusion on the left is unchanged. No significant spinal or
foraminal stenosis

L4-5: Moderately large central and left-sided disc protrusion with
slight interval progression. Mild spinal stenosis. Left subarticular
stenosis with impingement of the left L5 nerve root. Osteophyte
extends lateral to the foramen on the left unchanged.

L5-S1: Negative
IMPRESSION: Small extraforaminal disc protrusions on the left at L2-3 and L3-4
appear unchanged.

Central and left-sided disc protrusion L4-5 with slight progression.
Mild spinal stenosis unchanged. Subarticular stenosis with
impingement of the left L5 nerve root.

## 2019-02-02 DIAGNOSIS — M6281 Muscle weakness (generalized): Secondary | ICD-10-CM | POA: Diagnosis not present

## 2019-02-02 DIAGNOSIS — M4326 Fusion of spine, lumbar region: Secondary | ICD-10-CM | POA: Diagnosis not present

## 2019-02-02 DIAGNOSIS — M545 Low back pain: Secondary | ICD-10-CM | POA: Diagnosis not present

## 2019-02-10 DIAGNOSIS — M4326 Fusion of spine, lumbar region: Secondary | ICD-10-CM | POA: Diagnosis not present

## 2019-02-10 DIAGNOSIS — M545 Low back pain: Secondary | ICD-10-CM | POA: Diagnosis not present

## 2019-02-10 DIAGNOSIS — M6281 Muscle weakness (generalized): Secondary | ICD-10-CM | POA: Diagnosis not present

## 2019-02-17 DIAGNOSIS — M545 Low back pain: Secondary | ICD-10-CM | POA: Diagnosis not present

## 2019-02-17 DIAGNOSIS — M6281 Muscle weakness (generalized): Secondary | ICD-10-CM | POA: Diagnosis not present

## 2019-02-17 DIAGNOSIS — M4326 Fusion of spine, lumbar region: Secondary | ICD-10-CM | POA: Diagnosis not present

## 2019-02-26 DIAGNOSIS — M4326 Fusion of spine, lumbar region: Secondary | ICD-10-CM | POA: Diagnosis not present

## 2019-02-26 DIAGNOSIS — M545 Low back pain: Secondary | ICD-10-CM | POA: Diagnosis not present

## 2019-03-03 DIAGNOSIS — M6281 Muscle weakness (generalized): Secondary | ICD-10-CM | POA: Diagnosis not present

## 2019-03-03 DIAGNOSIS — M545 Low back pain: Secondary | ICD-10-CM | POA: Diagnosis not present

## 2019-03-03 DIAGNOSIS — M4326 Fusion of spine, lumbar region: Secondary | ICD-10-CM | POA: Diagnosis not present

## 2019-03-10 DIAGNOSIS — M6281 Muscle weakness (generalized): Secondary | ICD-10-CM | POA: Diagnosis not present

## 2019-03-10 DIAGNOSIS — M545 Low back pain: Secondary | ICD-10-CM | POA: Diagnosis not present

## 2019-03-10 DIAGNOSIS — M4326 Fusion of spine, lumbar region: Secondary | ICD-10-CM | POA: Diagnosis not present

## 2019-03-17 DIAGNOSIS — M4326 Fusion of spine, lumbar region: Secondary | ICD-10-CM | POA: Diagnosis not present

## 2019-03-17 DIAGNOSIS — M545 Low back pain: Secondary | ICD-10-CM | POA: Diagnosis not present

## 2019-03-17 DIAGNOSIS — M6281 Muscle weakness (generalized): Secondary | ICD-10-CM | POA: Diagnosis not present

## 2019-04-09 DIAGNOSIS — M4326 Fusion of spine, lumbar region: Secondary | ICD-10-CM | POA: Diagnosis not present

## 2019-04-09 DIAGNOSIS — Z6834 Body mass index (BMI) 34.0-34.9, adult: Secondary | ICD-10-CM | POA: Diagnosis not present

## 2019-04-09 DIAGNOSIS — I10 Essential (primary) hypertension: Secondary | ICD-10-CM | POA: Diagnosis not present

## 2019-04-09 DIAGNOSIS — M545 Low back pain: Secondary | ICD-10-CM | POA: Diagnosis not present

## 2019-06-08 ENCOUNTER — Telehealth (INDEPENDENT_AMBULATORY_CARE_PROVIDER_SITE_OTHER): Payer: BC Managed Care – PPO | Admitting: Registered Nurse

## 2019-06-08 ENCOUNTER — Encounter: Payer: Self-pay | Admitting: Registered Nurse

## 2019-06-08 DIAGNOSIS — I10 Essential (primary) hypertension: Secondary | ICD-10-CM

## 2019-06-08 MED ORDER — ENALAPRIL-HYDROCHLOROTHIAZIDE 5-12.5 MG PO TABS: 1.0000 | ORAL_TABLET | Freq: Every day | ORAL | 1 refills | Status: DC

## 2019-06-08 NOTE — Progress Notes (Signed)
Telemedicine Encounter- SOAP NOTE Established Patient  This telephone encounter was conducted with the patient's (or proxy's) verbal consent via audio telecommunications: yes  Patient was instructed to have this encounter in a suitably private space; and to only have persons present to whom they give permission to participate. In addition, patient identity was confirmed by use of name plus two identifiers (DOB and address).  I discussed the limitations, risks, security and privacy concerns of performing an evaluation and management service by telephone and the availability of in person appointments. I also discussed with the patient that there may be a patient responsible charge related to this service. The patient expressed understanding and agreed to proceed.  I spent a total of 15 minutes talking with the patient or their proxy.  No chief complaint on file.   Subjective   Amy Lamb is a 43 y.o. established patient. Telephone visit today to establish care and discuss medication refills  HPI Pt has a history significant for HTN and spinal fusion surgery  HTN: pt taking enalapril-HCTZ 5-12.5mg  PO qd with good effect. She states her BP runs 120-138/80s at home. She is not symptomatic for HTN. She has run out of medication for 5 days and reports her BP has not been elevated in that time.  Spinal fusion: 2 herniated discs in 2019, had surgery in October 2019. She states this has had positive effect. She notices some stiffness in her back but overall it is an improvement.  No urgent concerns at this time.  Patient Active Problem List   Diagnosis Date Noted  . Prediabetes 08/01/2018  . Essential hypertension 08/01/2018    Past Medical History:  Diagnosis Date  . Hypertension     Current Outpatient Medications  Medication Sig Dispense Refill  . cyclobenzaprine (FLEXERIL) 5 MG tablet TK 1 T PO QD    . Enalapril-hydroCHLOROthiazide 5-12.5 MG tablet Take 1 tablet by mouth  daily. 90 tablet 1  . meloxicam (MOBIC) 7.5 MG tablet TK 1 T PO BID     No current facility-administered medications for this visit.     No Known Allergies  Social History   Socioeconomic History  . Marital status: Single    Spouse name: Not on file  . Number of children: 0  . Years of education: Not on file  . Highest education level: Not on file  Occupational History  . Not on file  Social Needs  . Financial resource strain: Not hard at all  . Food insecurity    Worry: Never true    Inability: Never true  . Transportation needs    Medical: No    Non-medical: No  Tobacco Use  . Smoking status: Never Smoker  . Smokeless tobacco: Never Used  Substance and Sexual Activity  . Alcohol use: Yes    Comment: occasionally  . Drug use: No  . Sexual activity: Not Currently  Lifestyle  . Physical activity    Days per week: 0 days    Minutes per session: 0 min  . Stress: Not on file  Relationships  . Social connections    Talks on phone: More than three times a week    Gets together: Three times a week    Attends religious service: More than 4 times per year    Active member of club or organization: No    Attends meetings of clubs or organizations: Never    Relationship status: Never married  . Intimate partner violence  Fear of current or ex partner: No    Emotionally abused: No    Physically abused: No    Forced sexual activity: No  Other Topics Concern  . Not on file  Social History Narrative  . Not on file    ROS Per hpi   Objective   Vitals as reported by the patient: There were no vitals filed for this visit.  Diagnoses and all orders for this visit:  Essential hypertension -     Enalapril-hydroCHLOROthiazide 5-12.5 MG tablet; Take 1 tablet by mouth daily.   PLAN:  Pt is due for Annual visit / CPE / pap. She sees her mother daily, given COVID-19, I am giving her a 6 mo supply of medication with the expectation we see her in around that time for  this visit.  HTN is well controlled. Past labs reviewed - due for repeat, will check at upcoming visit.  No further concerns at this time. Pt feels well overall.  Patient encouraged to call clinic with any questions, comments, or concerns.    I discussed the assessment and treatment plan with the patient. The patient was provided an opportunity to ask questions and all were answered. The patient agreed with the plan and demonstrated an understanding of the instructions.   The patient was advised to call back or seek an in-person evaluation if the symptoms worsen or if the condition fails to improve as anticipated.  I provided 15 minutes of non-face-to-face time during this encounter.  Maximiano Coss, NP  Primary Care at [facility]

## 2019-06-08 NOTE — Progress Notes (Signed)
Spoke with pt and she informed me she needs to [facility] for management of medications.  She needs a refill on her hypertension medications. She states there are no other concerns.

## 2019-07-06 ENCOUNTER — Telehealth: Payer: Self-pay | Admitting: General Practice

## 2019-07-06 NOTE — Telephone Encounter (Signed)
Medication Refill - Medication: Enalapril-hydroCHLOROthiazide 5-12.5 MG tablet [[id number]]    Has the patient contacted their pharmacy? Yes.    (Agent: If yes, when and what did the pharmacy advise?) The patient was told that this had been filled until December but she is out of refills already. She needs a new prescription. She is currently out of this medication  Preferred Pharmacy (with phone number or street name):  [facility] [phone] - [place], [place] - [address] AT [address] [phone] (Phone) [phone] (Fax)     Agent: Please be advised that RX refills may take up to 3 business days. We ask that you follow-up with your pharmacy.

## 2019-07-08 DIAGNOSIS — M545 Low back pain: Secondary | ICD-10-CM | POA: Diagnosis not present

## 2019-07-08 DIAGNOSIS — M4326 Fusion of spine, lumbar region: Secondary | ICD-10-CM | POA: Diagnosis not present

## 2019-08-04 ENCOUNTER — Encounter: Payer: Self-pay | Admitting: Family Medicine

## 2019-08-04 ENCOUNTER — Ambulatory Visit: Payer: BC Managed Care – PPO | Admitting: Family Medicine

## 2019-08-04 ENCOUNTER — Other Ambulatory Visit: Payer: Self-pay

## 2019-08-04 VITALS — BP 142/84 | HR 97 | Temp 98.2°F | Resp 16 | Ht 65.0 in | Wt 215.0 lb

## 2019-08-04 DIAGNOSIS — R300 Dysuria: Secondary | ICD-10-CM | POA: Diagnosis not present

## 2019-08-04 DIAGNOSIS — R350 Frequency of micturition: Secondary | ICD-10-CM

## 2019-08-04 DIAGNOSIS — N3090 Cystitis, unspecified without hematuria: Secondary | ICD-10-CM

## 2019-08-04 LAB — POC MICROSCOPIC URINALYSIS (UMFC): Mucus: ABSENT

## 2019-08-04 LAB — POCT URINALYSIS DIP (MANUAL ENTRY)
Bilirubin, UA: NEGATIVE
Glucose, UA: NEGATIVE mg/dL
Ketones, POC UA: NEGATIVE mg/dL
Nitrite, UA: NEGATIVE
Protein Ur, POC: NEGATIVE mg/dL
Spec Grav, UA: 1.025 (ref 1.010–1.025)
Urobilinogen, UA: 0.2 E.U./dL
pH, UA: 6.5 (ref 5.0–8.0)

## 2019-08-04 MED ORDER — NITROFURANTOIN MONOHYD MACRO 100 MG PO CAPS
100.0000 mg | ORAL_CAPSULE | Freq: Two times a day (BID) | ORAL | 1 refills | Status: DC
Start: 2019-08-04 — End: 2020-05-05

## 2019-08-04 NOTE — Patient Instructions (Addendum)
  Drink plenty of fluids.  Take Macrobid (nitrofurantoin) 1 twice daily  If you should get worse such as fever and chills or worsening urinary symptoms come back for a recheck.  Culture is pending.  Take some over-the-counter Azo if desired to relieve the symptoms for a day or 2 while the antibiotics are beginning to take effect.  Return if worse  If you have lab work done today you will be contacted with your lab results within the next 2 weeks.  If you have not heard from [place] then please contact us. The fastest way to get your results is to register for My Chart.   IF you received an x-ray today, you will receive an invoice from [facility]. Please contact [facility] at [phone] with questions or concerns regarding your invoice.   IF you received labwork today, you will receive an invoice from [place]. Please contact [facility] at [phone] with questions or concerns regarding your invoice.   Our billing staff will not be able to assist you with questions regarding bills from these companies.  You will be contacted with the lab results as soon as they are available. The fastest way to get your results is to activate your My Chart account. Instructions are located on the last page of this paperwork. If you have not heard from [place] regarding the results in 2 weeks, please contact this office.

## 2019-08-04 NOTE — Progress Notes (Signed)
Patient ID: Amy Lamb, female    DOB: [birth year]  Age: 43 y.o. MRN: [id number]  Chief Complaint  Patient presents with  . Urinary Frequency    pt states after she finish urinating she feels pressure in her lower stomach. x 1 wk    Subjective:   43 year old lady who comes in here with history of having had some urinary frequency over the last couple of weeks worse.  She has more problems at night.  She is not passed any blood but she does have frequency.  She is sexually involved with a regular partner but this was coming on before her last sexual encounter.  She has not had problems with UTIs in the past.  She is currently working from home and she tries her best to avoid risk of COVID-19.  Current allergies, medications, problem list, past/family and social histories reviewed.  Objective:  BP (!) 142/84   Pulse 97   Temp 98.2 F (36.8 C) (Oral)   Resp 16   Ht 5\' 5"  (1.651 m)   Wt 215 lb (97.5 kg)   LMP 07/16/2019   SpO2 99%   BMI 35.78 kg/m   No major acute distress.  I failed to mention above that she does have a history of prediabetes but is not on any medicines for that, though she does take enalapril hydrochlorothiazide for her blood pressure.  No CVA tenderness.  Abdomen soft without mass or tenderness.  Urinalysis has many WBCs insistent with a UTI.  Assessment & Plan:   Assessment: 1. Cystitis   2. Frequent urination   3. Dysuria       Plan: See instructions  Orders Placed This Encounter  Procedures  . Urine Culture  . POCT urinalysis dipstick  . POCT Microscopic Urinalysis ([facility])    Meds ordered this encounter  Medications  . nitrofurantoin, macrocrystal-monohydrate, (MACROBID) 100 MG capsule    Sig: Take 1 capsule (100 mg total) by mouth 2 (two) times daily.    Dispense:  14 capsule    Refill:  1         Patient Instructions    Drink plenty of fluids.  Take Macrobid (nitrofurantoin) 1 twice daily  If you should get worse such as  fever and chills or worsening urinary symptoms come back for a recheck.  Culture is pending.  Take some over-the-counter Azo if desired to relieve the symptoms for a day or 2 while the antibiotics are beginning to take effect.  Return if worse  If you have lab work done today you will be contacted with your lab results within the next 2 weeks.  If you have not heard from [place] then please contact us. The fastest way to get your results is to register for My Chart.   IF you received an x-ray today, you will receive an invoice from [facility]. Please contact [facility] at [phone] with questions or concerns regarding your invoice.   IF you received labwork today, you will receive an invoice from [place]. Please contact [facility] at [phone] with questions or concerns regarding your invoice.   Our billing staff will not be able to assist you with questions regarding bills from these companies.  You will be contacted with the lab results as soon as they are available. The fastest way to get your results is to activate your My Chart account. Instructions are located on the last page of this paperwork. If you have not heard from [place] regarding the results in  2 weeks, please contact this office.         Return if symptoms worsen or fail to improve.   Ruben Reason, MD 08/04/2019

## 2019-08-05 LAB — URINE CULTURE

## 2019-08-10 ENCOUNTER — Encounter: Payer: Self-pay | Admitting: Radiology

## 2019-08-13 ENCOUNTER — Other Ambulatory Visit: Payer: Self-pay | Admitting: Registered Nurse

## 2019-08-13 DIAGNOSIS — I10 Essential (primary) hypertension: Secondary | ICD-10-CM

## 2019-08-13 NOTE — Telephone Encounter (Signed)
Medication Refill - Medication: Enalapril-hydroCHLOROthiazide 5-12.5 MG tablet  Has the patient contacted their pharmacy? Yes - states that at last [place] said he would approve this for 6 months but she has to call in every month.  She wants to see if that can be changed (Agent: If no, request that the patient contact the pharmacy for the refill.) (Agent: If yes, when and what did the pharmacy advise?)  Preferred Pharmacy (with phone number or street name):  [facility] [phone] - [place], [place] - [address] AT [address] [phone] (Phone) [phone] (Fax)   Agent: Please be advised that RX refills may take up to 3 business days. We ask that you follow-up with your pharmacy.

## 2019-08-13 NOTE — Telephone Encounter (Signed)
Requested medication (s) are due for refill today: yes  Requested medication (s) are on the active medication list: yes  Last refill: 05/2019  Future visit scheduled: no  Notes to clinic:  Review for refill  Requested Prescriptions  Pending Prescriptions Disp Refills   Enalapril-hydroCHLOROthiazide 5-12.5 MG tablet 90 tablet 1    Sig: Take 1 tablet by mouth daily.     Cardiovascular:  ACEI + Diuretic Combos Failed - 08/13/2019 12:06 PM      Failed - Na in normal range and within 180 days    Sodium  Date Value Ref Range Status  10/14/2010 136 135 - 145 mEq/L Final         Failed - K in normal range and within 180 days    Potassium  Date Value Ref Range Status  10/14/2010 4.0 3.5 - 5.1 mEq/L Final         Failed - Cr in normal range and within 180 days    Creatinine, Ser  Date Value Ref Range Status  10/14/2010 0.52 0.4 - 1.2 mg/dL Final         Failed - Ca in normal range and within 180 days    Calcium  Date Value Ref Range Status  10/14/2010 9.3 8.4 - 10.5 mg/dL Final         Failed - Last BP in normal range    BP Readings from Last 1 Encounters:  08/04/19 (!) 142/84         Passed - Patient is not pregnant      Passed - Valid encounter within last 6 months    Recent Outpatient Visits          1 week ago Cystitis   Primary Care at [facility], Fenton Malling, MD   1 year ago Essential hypertension   Primary Care at [facility], [place], [place]

## 2019-08-16 MED ORDER — ENALAPRIL-HYDROCHLOROTHIAZIDE 5-12.5 MG PO TABS: 1.0000 | ORAL_TABLET | Freq: Every day | ORAL | 1 refills | Status: DC

## 2019-10-19 DIAGNOSIS — M5416 Radiculopathy, lumbar region: Secondary | ICD-10-CM | POA: Diagnosis not present

## 2019-10-19 DIAGNOSIS — M4326 Fusion of spine, lumbar region: Secondary | ICD-10-CM | POA: Diagnosis not present

## 2019-10-19 DIAGNOSIS — M545 Low back pain: Secondary | ICD-10-CM | POA: Diagnosis not present

## 2020-01-18 ENCOUNTER — Other Ambulatory Visit: Payer: Self-pay | Admitting: General Practice

## 2020-01-18 DIAGNOSIS — I10 Essential (primary) hypertension: Secondary | ICD-10-CM

## 2020-01-18 MED ORDER — ENALAPRIL-HYDROCHLOROTHIAZIDE 5-12.5 MG PO TABS: 1.0000 | ORAL_TABLET | Freq: Every day | ORAL | 0 refills | Status: DC

## 2020-01-18 NOTE — Telephone Encounter (Signed)
Requested Prescriptions  Pending Prescriptions Disp Refills  . Enalapril-hydroCHLOROthiazide 5-12.5 MG tablet 90 tablet 0    Sig: Take 1 tablet by mouth daily.     Cardiovascular:  ACEI + Diuretic Combos Failed - 01/18/2020  3:27 PM      Failed - Na in normal range and within 180 days    Sodium  Date Value Ref Range Status  10/14/2010 136 135 - 145 mEq/L Final         Failed - K in normal range and within 180 days    Potassium  Date Value Ref Range Status  10/14/2010 4.0 3.5 - 5.1 mEq/L Final         Failed - Cr in normal range and within 180 days    Creatinine, Ser  Date Value Ref Range Status  10/14/2010 0.52 0.4 - 1.2 mg/dL Final         Failed - Ca in normal range and within 180 days    Calcium  Date Value Ref Range Status  10/14/2010 9.3 8.4 - 10.5 mg/dL Final         Failed - Last BP in normal range    BP Readings from Last 1 Encounters:  08/04/19 (!) 142/84         Passed - Patient is not pregnant      Passed - Valid encounter within last 6 months    Recent Outpatient Visits          5 months ago Cystitis   Primary Care at [place], Fenton Malling, MD   7 months ago Essential hypertension   Primary Care at [facility], Delfino Lovett, NP   1 year ago Essential hypertension   Primary Care at [facility], [place], [place]

## 2020-01-18 NOTE — Telephone Encounter (Signed)
Medication Refill - Medication:  Enalapril-hydroCHLOROthiazide 5-12.5 MG tablet   Has the patient contacted their pharmacy? Yes advised to call office.  Preferred Pharmacy (with phone number or street name):  [facility] [phone] - [place], [place] AT [address] Phone:  [phone]  Fax:  [phone]     Agent: Please be advised that RX refills may take up to 3 business days. We ask that you follow-up with your pharmacy.

## 2020-03-16 ENCOUNTER — Ambulatory Visit: Payer: Self-pay | Attending: Internal Medicine

## 2020-03-16 DIAGNOSIS — Z23 Encounter for immunization: Secondary | ICD-10-CM

## 2020-04-03 ENCOUNTER — Encounter (HOSPITAL_COMMUNITY): Payer: Self-pay

## 2020-04-03 ENCOUNTER — Ambulatory Visit (HOSPITAL_COMMUNITY)
Admission: EM | Admit: 2020-04-03 | Discharge: 2020-04-03 | Disposition: A | Discharge status: Home / Self Care | Payer: BC Managed Care – PPO | Attending: Physician Assistant | Admitting: Physician Assistant

## 2020-04-03 ENCOUNTER — Other Ambulatory Visit: Payer: Self-pay

## 2020-04-03 DIAGNOSIS — H9201 Otalgia, right ear: Secondary | ICD-10-CM

## 2020-04-03 DIAGNOSIS — H6121 Impacted cerumen, right ear: Secondary | ICD-10-CM

## 2020-04-03 MED ORDER — FLUTICASONE PROPIONATE 50 MCG/ACT NA SUSP: 1.0000 | Freq: Every day | NASAL | 0 refills | Status: DC

## 2020-04-03 MED ORDER — CETIRIZINE HCL 10 MG PO TABS: 10.0000 mg | ORAL_TABLET | Freq: Every day | ORAL | 0 refills | Status: DC

## 2020-04-03 NOTE — ED Triage Notes (Signed)
Pt states she has right ear pain x 2 weeks.

## 2020-04-03 NOTE — ED Provider Notes (Signed)
[place]    CSN: [id number] Arrival date & time: 04/03/20  1513      History   Chief Complaint Chief Complaint  Patient presents with  . Otalgia    HPI Amy Lamb is a 44 y.o. female.   Patient presents for right-sided ear pain and concern of ear being clogged.  She reports history of having issues with a lot of earwax.  She reports around 2 weeks ago she used peroxide/Debrox solution and had some relief.  However since then she has had a large accumulation of which she believes is earwax and diminished hearing in the right ear.  She has had some ringing in the right ear.  Intermittent pain is reported as well.  Denies fever or chills.  Does not hurt to move the ear.  Pain does not radiate into the jaw or down the neck.   She reports a history of having fluid in her ears that had tubes placed 3 years ago.  She does report some issues with allergies over the years and is not currently on an allergy regiment.     Past Medical History:  Diagnosis Date  . Hypertension     Patient Active Problem List   Diagnosis Date Noted  . Prediabetes 08/01/2018  . Essential hypertension 08/01/2018    Past Surgical History:  Procedure Laterality Date  . SPINAL FUSION  09/2018    OB History   No obstetric history on file.      Home Medications    Prior to Admission medications   Medication Sig Start Date End Date Taking? Authorizing Provider  cetirizine (ZYRTEC ALLERGY) 10 MG tablet Take 1 tablet (10 mg total) by mouth daily. 04/03/20   Lagena Strand, Marguerita Beards, PA-C  cyclobenzaprine (FLEXERIL) 5 MG tablet TK 1 T PO QD 04/09/19   [provider]  Enalapril-hydroCHLOROthiazide 5-12.5 MG tablet Take 1 tablet by mouth daily. 01/18/20   Cannady, Henrine Screws T, NP  fluticasone (FLONASE) 50 MCG/ACT nasal spray Place 1 spray into both nostrils daily. 04/03/20   Irvin Bastin, Marguerita Beards, PA-C  meloxicam (MOBIC) 7.5 MG tablet TK 1 T PO BID 04/11/19   [provider]  nitrofurantoin,  macrocrystal-monohydrate, (MACROBID) 100 MG capsule Take 1 capsule (100 mg total) by mouth 2 (two) times daily. 08/04/19   Posey Boyer, MD    Family History Family History  Problem Relation Age of Onset  . Diabetes Mother   . Hyperlipidemia Mother   . Hypertension Mother   . Heart disease Maternal Grandmother   . Hyperlipidemia Maternal Grandmother   . Hypertension Maternal Grandmother     Social History Social History   Tobacco Use  . Smoking status: Never Smoker  . Smokeless tobacco: Never Used  Substance Use Topics  . Alcohol use: Yes    Comment: occasionally  . Drug use: No     Allergies   Patient has no known allergies.   Review of Systems Review of Systems   Physical Exam Triage Vital Signs ED Triage Vitals  Enc Vitals Group     BP 04/03/20 1605 140/76     Pulse Rate 04/03/20 1605 89     Resp 04/03/20 1605 18     Temp 04/03/20 1605 98.4 F (36.9 C)     Temp Source 04/03/20 1605 Oral     SpO2 04/03/20 1605 100 %     Weight 04/03/20 1604 214 lb (97.1 kg)     Height --  Head Circumference --      Peak Flow --      Pain Score 04/03/20 1604 6     Pain Loc --      Pain Edu? --      Excl. in [place]? --    No data found.  Updated Vital Signs BP 140/76 (BP Location: Right Arm)   Pulse 89   Temp 98.4 F (36.9 C) (Oral)   Resp 18   Wt 214 lb (97.1 kg)   LMP 03/21/2020   SpO2 100%   BMI 35.61 kg/m   Visual Acuity Right Eye Distance:   Left Eye Distance:   Bilateral Distance:    Right Eye Near:   Left Eye Near:    Bilateral Near:     Physical Exam Vitals and nursing note reviewed.  Constitutional:      General: She is not in acute distress.    Appearance: She is well-developed. She is not ill-appearing.  HENT:     Head: Normocephalic and atraumatic.     Ears:     Comments: Patient with impacted cerumen on initial exam of the right ear.  Able to irrigate and remove large amount of impacted cerumen.  Tympanostomy tube was retained in  impacted cerumen. Post removal- TM grey and completely intact. Good landmarks with cone of light. Minimal serous effusion  Left TM with small serous effusion. Otherwise pearly grey with good landmarks Eyes:     Conjunctiva/sclera: Conjunctivae normal.  Cardiovascular:     Rate and Rhythm: Normal rate.     Heart sounds: No murmur.  Pulmonary:     Effort: Pulmonary effort is normal. No respiratory distress.  Musculoskeletal:     Cervical back: Neck supple.  Skin:    General: Skin is warm and dry.  Neurological:     Mental Status: She is alert.      UC Treatments / Results  Labs (all labs ordered are listed, but only abnormal results are displayed) Labs Reviewed - No data to display  EKG   Radiology No results found.  Procedures Ear Cerumen Removal  Date/Time: 04/03/2020 6:00 PM Performed by: Purnell Shoemaker, PA-C Authorized by: Purnell Shoemaker, PA-C   Consent:    Consent obtained:  Verbal   Consent given by:  Patient   Risks discussed:  Bleeding, dizziness, incomplete removal and TM perforation   Alternatives discussed:  Alternative treatment Procedure details:    Location:  R ear   Procedure type: irrigation   Post-procedure details:    Inspection:  TM intact   Hearing quality:  Normal   Patient tolerance of procedure:  Tolerated well, no immediate complications   (including critical care time)  Medications Ordered in UC Medications - No data to display  Initial Impression / Assessment and Plan / UC Course  I have reviewed the triage vital signs and the nursing notes.  Pertinent labs & imaging results that were available during my care of the patient were reviewed by me and considered in my medical decision making (see chart for details).     #Impacted cerumen #Right ear pain Patient is a 44 year old presenting with impacted cerumen in the right ear.  Successful removal.  Believe pain was related to serous effusion with severe impaction.  Tympanostomy tube  in impacted cerumen however TM is intact without evidence of perforation.  Patient had normalization of her hearing.  Recommended patient start Flonase and Zyrtec.  Patient to follow-up with primary care for further management of  ear complaints. Final Clinical Impressions(s) / UC Diagnoses   Final diagnoses:  Impacted cerumen of right ear  Right ear pain     Discharge Instructions     Start the Flonase and Zyrtec daily  Limit the amount of Q-tip in cleaning of your ears.  If you begin to feel clogging use Debrox in that ear.  Follow-up with your primary care for further management if persistent issues with hearing.    ED Prescriptions    Medication Sig Dispense Auth. Provider   fluticasone (FLONASE) 50 MCG/ACT nasal spray Place 1 spray into both nostrils daily. 11.1 mL Hajime Asfaw, Marguerita Beards, PA-C   cetirizine (ZYRTEC ALLERGY) 10 MG tablet Take 1 tablet (10 mg total) by mouth daily. 30 tablet Donye Campanelli, Marguerita Beards, PA-C     PDMP not reviewed this encounter.   Purnell Shoemaker, PA-C 04/03/20 Johnnye Lana

## 2020-04-03 NOTE — Discharge Instructions (Signed)
Start the Flonase and Zyrtec daily  Limit the amount of Q-tip in cleaning of your ears.  If you begin to feel clogging use Debrox in that ear.  Follow-up with your primary care for further management if persistent issues with hearing.

## 2020-04-04 ENCOUNTER — Ambulatory Visit: Payer: BC Managed Care – PPO | Admitting: Registered Nurse

## 2020-04-12 ENCOUNTER — Ambulatory Visit: Payer: BC Managed Care – PPO | Attending: Internal Medicine

## 2020-04-12 DIAGNOSIS — Z23 Encounter for immunization: Secondary | ICD-10-CM

## 2020-04-12 NOTE — Progress Notes (Signed)
   Covid-19 Vaccination Clinic  Name:  Amy Lamb    MRN: [id number] DOB: 1976-11-07  04/12/2020  [name] was observed post Covid-19 immunization for 15 minutes without incident. She was provided with Vaccine Information Sheet and instruction to access the V-Safe system.   [name] was instructed to call 911 with any severe reactions post vaccine: Marland Kitchen Difficulty breathing  . Swelling of face and throat  . A fast heartbeat  . A bad rash all over body  . Dizziness and weakness   Immunizations Administered    Name Date Dose VIS Date Route   Pfizer COVID-19 Vaccine 04/12/2020  3:08 PM 0.3 mL 02/09/2019 Intramuscular   Manufacturer: [place]   Lot: [id number]   [place]: [id number]

## 2020-04-27 ENCOUNTER — Other Ambulatory Visit: Payer: Self-pay | Admitting: Family Medicine

## 2020-04-27 DIAGNOSIS — I10 Essential (primary) hypertension: Secondary | ICD-10-CM

## 2020-04-27 MED ORDER — ENALAPRIL-HYDROCHLOROTHIAZIDE 5-12.5 MG PO TABS: 1.0000 | ORAL_TABLET | Freq: Every day | ORAL | 0 refills | Status: DC

## 2020-04-27 NOTE — Telephone Encounter (Signed)
Call to patient- notified she needs to schedule appointment- transferred to office to schedule. Courtesy RF #[phone] given

## 2020-04-27 NOTE — Telephone Encounter (Signed)
Copied from [place] [id number]. Topic: Quick Communication - Rx Refill/Question >> Apr 27, 2020  1:44 PM Leward Quan A wrote: Medication: Enalapril-hydroCHLOROthiazide 5-12.5 MG tablet  Has the patient contacted their pharmacy? Yes.   (Agent: If no, request that the patient contact the pharmacy for the refill.) (Agent: If yes, when and what did the pharmacy advise?)  Preferred Pharmacy (with phone number or street name): [facility] [phone] - [place], [address]  Phone:  [phone] Fax:  [phone]     Agent: Please be advised that RX refills may take up to 3 business days. We ask that you follow-up with your pharmacy.

## 2020-05-05 ENCOUNTER — Ambulatory Visit (INDEPENDENT_AMBULATORY_CARE_PROVIDER_SITE_OTHER): Payer: BC Managed Care – PPO | Admitting: Registered Nurse

## 2020-05-05 ENCOUNTER — Other Ambulatory Visit: Payer: Self-pay

## 2020-05-05 ENCOUNTER — Encounter: Payer: Self-pay | Admitting: Registered Nurse

## 2020-05-05 VITALS — BP 128/78 | HR 85 | Temp 97.9°F | Ht 65.0 in | Wt 216.0 lb

## 2020-05-05 DIAGNOSIS — I10 Essential (primary) hypertension: Secondary | ICD-10-CM | POA: Diagnosis not present

## 2020-05-05 DIAGNOSIS — Z13220 Encounter for screening for lipoid disorders: Secondary | ICD-10-CM

## 2020-05-05 DIAGNOSIS — R309 Painful micturition, unspecified: Secondary | ICD-10-CM

## 2020-05-05 DIAGNOSIS — Z130 Encounter for screening for diseases of the blood and blood-forming organs and certain disorders involving the immune mechanism: Secondary | ICD-10-CM

## 2020-05-05 DIAGNOSIS — Z1329 Encounter for screening for other suspected endocrine disorder: Secondary | ICD-10-CM

## 2020-05-05 DIAGNOSIS — Z13228 Encounter for screening for other metabolic disorders: Secondary | ICD-10-CM

## 2020-05-05 LAB — POCT URINALYSIS DIP (MANUAL ENTRY)
Bilirubin, UA: NEGATIVE
Blood, UA: NEGATIVE
Glucose, UA: NEGATIVE mg/dL
Ketones, POC UA: NEGATIVE mg/dL
Nitrite, UA: NEGATIVE
Protein Ur, POC: NEGATIVE mg/dL
Spec Grav, UA: 1.02 (ref 1.010–1.025)
Urobilinogen, UA: 0.2 E.U./dL
pH, UA: 5.5 (ref 5.0–8.0)

## 2020-05-05 LAB — LIPID PANEL
Chol/HDL Ratio: 3.3 ratio (ref 0.0–4.4)
Cholesterol, Total: 203 mg/dL — ABNORMAL HIGH (ref 100–199)
HDL: 61 mg/dL (ref >39)
LDL Chol Calc (NIH): 130 mg/dL — ABNORMAL HIGH (ref 0–99)
Triglycerides: 67 mg/dL (ref 0–149)
VLDL Cholesterol Cal: 12 mg/dL (ref 5–40)

## 2020-05-05 LAB — CBC WITH DIFFERENTIAL
Basophils Absolute: 0 10*3/uL (ref 0.0–0.2)
Basos: 1 %
EOS (ABSOLUTE): 0.1 10*3/uL (ref 0.0–0.4)
Eos: 2 %
Hematocrit: 37.7 % (ref 34.0–46.6)
Hemoglobin: 12.6 g/dL (ref 11.1–15.9)
Immature Grans (Abs): 0 10*3/uL (ref 0.0–0.1)
Immature Granulocytes: 0 %
Lymphocytes Absolute: 1.8 10*3/uL (ref 0.7–3.1)
Lymphs: 37 %
MCH: 29.7 pg (ref 26.6–33.0)
MCHC: 33.4 g/dL (ref 31.5–35.7)
MCV: 89 fL (ref 79–97)
Monocytes Absolute: 0.5 10*3/uL (ref 0.1–0.9)
Monocytes: 11 %
Neutrophils Absolute: 2.4 10*3/uL (ref 1.4–7.0)
Neutrophils: 49 %
RBC: 4.24 x10E6/uL (ref 3.77–5.28)
RDW: 12.9 % (ref 11.7–15.4)
WBC: 4.9 10*3/uL (ref 3.4–10.8)

## 2020-05-05 LAB — COMPREHENSIVE METABOLIC PANEL
ALT: 11 IU/L (ref 0–32)
AST: 15 IU/L (ref 0–40)
Albumin/Globulin Ratio: 1.2 (ref 1.2–2.2)
Albumin: 4.2 g/dL (ref 3.8–4.8)
Alkaline Phosphatase: 72 IU/L (ref 48–121)
BUN/Creatinine Ratio: 14 (ref 9–23)
BUN: 11 mg/dL (ref 6–24)
Bilirubin Total: <0.2 mg/dL (ref 0.0–1.2)
CO2: 24 mmol/L (ref 20–29)
Calcium: 10.1 mg/dL (ref 8.7–10.2)
Chloride: 99 mmol/L (ref 96–106)
Creatinine, Ser: 0.79 mg/dL (ref 0.57–1.00)
GFR calc Af Amer: 105 mL/min/{1.73_m2} (ref >59)
GFR calc non Af Amer: 91 mL/min/{1.73_m2} (ref >59)
Globulin, Total: 3.4 g/dL (ref 1.5–4.5)
Glucose: 95 mg/dL (ref 65–99)
Potassium: 4 mmol/L (ref 3.5–5.2)
Sodium: 137 mmol/L (ref 134–144)
Total Protein: 7.6 g/dL (ref 6.0–8.5)

## 2020-05-05 LAB — TSH: TSH: 0.738 u[IU]/mL (ref 0.450–4.500)

## 2020-05-05 LAB — POCT GLYCOSYLATED HEMOGLOBIN (HGB A1C): Hemoglobin A1C: 5.5 % (ref 4.0–5.6)

## 2020-05-05 MED ORDER — SULFAMETHOXAZOLE-TRIMETHOPRIM 800-160 MG PO TABS: 1.0000 | ORAL_TABLET | Freq: Two times a day (BID) | ORAL | 0 refills | Status: DC

## 2020-05-05 MED ORDER — ENALAPRIL-HYDROCHLOROTHIAZIDE 5-12.5 MG PO TABS: 1.0000 | ORAL_TABLET | Freq: Every day | ORAL | 3 refills | Status: DC

## 2020-05-05 NOTE — Progress Notes (Signed)
Acute Office Visit  Subjective:    Patient ID: Amy Lamb, female    DOB: 1976/04/22, 44 y.o.   MRN: [id number]  Chief Complaint  Patient presents with  . burning and pain with urination    x 3 weeks - has been taking AZO     HPI Patient is in today for medication refills and dysuria.  HTN: taking enalapril-HCTZ 5-12.5mg  PO qd with good effect. No concerns at this time. Denies AEs of htn including headache, chest pain, shob, doe, visual changes, dependent edema, and claudication.   Dysuria: ongoing 2-3 weeks. Waxing and waning with use of Azo. Today noted some suprapubic pain. Denies flank pain, fevers, chills, sweats. Denies vaginal symptoms and new sexual partners. Has had UTI in past, but no recent abx use.  Otherwise feeling well and without complaint today.   Past Medical History:  Diagnosis Date  . Hypertension     Past Surgical History:  Procedure Laterality Date  . SPINAL FUSION  09/2018    Family History  Problem Relation Age of Onset  . Diabetes Mother   . Hyperlipidemia Mother   . Hypertension Mother   . Heart disease Maternal Grandmother   . Hyperlipidemia Maternal Grandmother   . Hypertension Maternal Grandmother     Social History   Socioeconomic History  . Marital status: Single    Spouse name: Not on file  . Number of children: 0  . Years of education: Not on file  . Highest education level: Not on file  Occupational History  . Not on file  Tobacco Use  . Smoking status: Never Smoker  . Smokeless tobacco: Never Used  Substance and Sexual Activity  . Alcohol use: Yes    Comment: occasionally  . Drug use: No  . Sexual activity: Not Currently  Other Topics Concern  . Not on file  Social History Narrative  . Not on file   Social Determinants of Health   Financial Resource Strain: Low Risk   . Difficulty of Paying Living Expenses: Not hard at all  Food Insecurity: No Food Insecurity  . Worried About Charity fundraiser in the  Last Year: Never true  . Ran Out of Food in the Last Year: Never true  Transportation Needs: No Transportation Needs  . Lack of Transportation (Medical): No  . Lack of Transportation (Non-Medical): No  Physical Activity: Inactive  . Days of Exercise per Week: 0 days  . Minutes of Exercise per Session: 0 min  Stress:   . Feeling of Stress :   Social Connections: Somewhat Isolated  . Frequency of Communication with Friends and Family: More than three times a week  . Frequency of Social Gatherings with Friends and Family: Three times a week  . Attends Religious Services: More than 4 times per year  . Active Member of Clubs or Organizations: No  . Attends Archivist Meetings: Never  . Marital Status: Never married  Intimate Partner Violence: Not At Risk  . Fear of Current or Ex-Partner: No  . Emotionally Abused: No  . Physically Abused: No  . Sexually Abused: No    Outpatient Medications Prior to Visit  Medication Sig Dispense Refill  . cyanocobalamin 100 MCG tablet Take 100 mcg by mouth daily.    . Enalapril-hydroCHLOROthiazide 5-12.5 MG tablet Take 1 tablet by mouth daily. 30 tablet 0  . meloxicam (MOBIC) 7.5 MG tablet TK 1 T PO BID    . Multiple Vitamins-Minerals (HAIR SKIN AND  NAILS FORMULA PO) Take by mouth.    Marland Kitchen VITAMIN D, CHOLECALCIFEROL, PO Take by mouth.    . cetirizine (ZYRTEC ALLERGY) 10 MG tablet Take 1 tablet (10 mg total) by mouth daily. (Patient not taking: Reported on 05/05/2020) 30 tablet 0  . cyclobenzaprine (FLEXERIL) 5 MG tablet TK 1 T PO QD    . fluticasone (FLONASE) 50 MCG/ACT nasal spray Place 1 spray into both nostrils daily. (Patient not taking: Reported on 05/05/2020) 11.1 mL 0  . nitrofurantoin, macrocrystal-monohydrate, (MACROBID) 100 MG capsule Take 1 capsule (100 mg total) by mouth 2 (two) times daily. 14 capsule 1   No facility-administered medications prior to visit.    No Known Allergies  Review of Systems  Constitutional: Negative.     HENT: Negative.   Eyes: Negative.   Respiratory: Negative.   Cardiovascular: Negative.   Gastrointestinal: Negative.   Endocrine: Negative.   Genitourinary: Positive for dysuria. Negative for decreased urine volume, difficulty urinating, dyspareunia, enuresis, flank pain, frequency, genital sores, hematuria, menstrual problem, pelvic pain, urgency, vaginal bleeding, vaginal discharge and vaginal pain.  Musculoskeletal: Negative.   Skin: Negative.   Allergic/Immunologic: Negative.   Neurological: Negative.   Hematological: Negative.   Psychiatric/Behavioral: Negative.   All other systems reviewed and are negative.      Objective:    Physical Exam Vitals reviewed.  Constitutional:      Appearance: Normal appearance. She is normal weight.  Cardiovascular:     Rate and Rhythm: Normal rate.     Pulses: Normal pulses.     Heart sounds: Normal heart sounds. No murmur. No friction rub. No gallop.   Pulmonary:     Effort: Pulmonary effort is normal. No respiratory distress.     Breath sounds: Normal breath sounds. No stridor. No wheezing, rhonchi or rales.  Chest:     Chest wall: No tenderness.  Musculoskeletal:        General: Normal range of motion.  Skin:    General: Skin is warm and dry.     Capillary Refill: Capillary refill takes less than 2 seconds.     Coloration: Skin is not jaundiced or pale.     Findings: No bruising, erythema or rash.  Neurological:     General: No focal deficit present.     Mental Status: She is alert and oriented to person, place, and time. Mental status is at baseline.     Cranial Nerves: No cranial nerve deficit.  Psychiatric:        Behavior: Behavior normal.        Thought Content: Thought content normal.        Judgment: Judgment normal.     BP 128/78   Pulse 85   Temp 97.9 F (36.6 C)   Ht 5\' 5"  (1.651 m)   Wt 216 lb (98 kg)   LMP 04/21/2020 (Approximate)   SpO2 100%   BMI 35.94 kg/m  Wt Readings from Last 3 Encounters:   05/05/20 216 lb (98 kg)  04/03/20 214 lb (97.1 kg)  08/04/19 215 lb (97.5 kg)    Health Maintenance Due  Topic Date Due  . HIV Screening  Never done  . TETANUS/TDAP  Never done  . PAP SMEAR-Modifier  Never done    There are no preventive care reminders to display for this patient.   No results found for: TSH Lab Results  Component Value Date   WBC 5.5 10/14/2010   HGB 11.4 (L) 10/14/2010   HCT 35.5 (L) 10/14/2010  MCV 91.3 10/14/2010   PLT 299 10/14/2010   Lab Results  Component Value Date   NA 136 10/14/2010   K 4.0 10/14/2010   CO2 27 10/14/2010   GLUCOSE 82 10/14/2010   BUN 13 10/14/2010   CREATININE 0.52 10/14/2010   CALCIUM 9.3 10/14/2010   No results found for: CHOL No results found for: HDL No results found for: LDLCALC No results found for: TRIG No results found for: CHOLHDL No results found for: HGBA1C     Assessment & Plan:   Problem List Items Addressed This Visit      Cardiovascular and Mediastinum   Essential hypertension   Relevant Medications   Enalapril-hydroCHLOROthiazide 5-12.5 MG tablet    Other Visit Diagnoses    Painful urination    -  Primary   Relevant Medications   sulfamethoxazole-trimethoprim (BACTRIM DS) 800-160 MG tablet   Other Relevant Orders   POCT urinalysis dipstick (Completed)   Urine Culture   Screening for endocrine, metabolic and immunity disorder       Relevant Orders   CBC With Differential   Comprehensive metabolic panel   TSH   POCT glycosylated hemoglobin (Hb A1C)   Lipid screening       Relevant Orders   Lipid panel       Meds ordered this encounter  Medications  . sulfamethoxazole-trimethoprim (BACTRIM DS) 800-160 MG tablet    Sig: Take 1 tablet by mouth 2 (two) times daily.    Dispense:  6 tablet    Refill:  0    Order Specific Question:   Supervising Provider    Answer:   Delia Chimes A [id number]  . Enalapril-hydroCHLOROthiazide 5-12.5 MG tablet    Sig: Take 1 tablet by mouth daily.     Dispense:  90 tablet    Refill:  3    Order Specific Question:   Supervising Provider    Answer:   Forrest Moron [id number]   PLAN  Labs collected, will follow up as warranted  Continue enalapril-HCTZ 5-12.5mg  po qd  Urine culture sent  Bactrim po bid for 3 days  Patient encouraged to call clinic with any questions, comments, or concerns.  Maximiano Coss, NP

## 2020-05-05 NOTE — Patient Instructions (Signed)
° ° ° °  If you have lab work done today you will be contacted with your lab results within the next 2 weeks.  If you have not heard from us then please contact us. The fastest way to get your results is to register for My Chart. ° ° °IF you received an x-ray today, you will receive an invoice from [facility]. Please contact [facility] at [phone] with questions or concerns regarding your invoice.  ° °IF you received labwork today, you will receive an invoice from [facility]. Please contact [facility] at [phone] with questions or concerns regarding your invoice.  ° °Our billing staff will not be able to assist you with questions regarding bills from these companies. ° °You will be contacted with the lab results as soon as they are available. The fastest way to get your results is to activate your My Chart account. Instructions are located on the last page of this paperwork. If you have not heard from us regarding the results in 2 weeks, please contact this office. °  ° ° ° °

## 2020-05-06 LAB — URINE CULTURE

## 2020-05-12 ENCOUNTER — Telehealth: Payer: Self-pay

## 2020-05-12 NOTE — Telephone Encounter (Signed)
Pt. Called asserting Amy Lamb had asked her to call back if medication was not working. Pt. Is reporting that she is still experience discomfort urinating.

## 2020-05-12 NOTE — Telephone Encounter (Signed)
Pt is still having discomfort with urination pt states you wanted her to call back if medication was not working. Please advise.

## 2020-06-26 NOTE — Progress Notes (Signed)
Letter SENT 

## 2020-08-03 ENCOUNTER — Ambulatory Visit: Payer: BC Managed Care – PPO | Admitting: Family Medicine

## 2020-08-11 ENCOUNTER — Ambulatory Visit: Payer: BC Managed Care – PPO | Admitting: Registered Nurse

## 2020-08-11 ENCOUNTER — Encounter: Payer: Self-pay | Admitting: Registered Nurse

## 2020-08-11 ENCOUNTER — Other Ambulatory Visit: Payer: Self-pay

## 2020-08-11 VITALS — BP 132/81 | HR 90 | Temp 98.0°F | Resp 18 | Ht 65.0 in | Wt 211.2 lb

## 2020-08-11 DIAGNOSIS — K5904 Chronic idiopathic constipation: Secondary | ICD-10-CM | POA: Diagnosis not present

## 2020-08-11 NOTE — Progress Notes (Signed)
Established Patient Office Visit  Subjective:  Patient ID: Amy Lamb, female    DOB: 1976-03-26  Age: 44 y.o. MRN: [id number]  CC:  Chief Complaint  Patient presents with  . Constipation    Patient states she has been constipated for almost a month now and she is starting to lose energy and its uncomfortable. Per patient she has been going up to 4 days at a time without making a bowel movement and has taking many OTC medications    HPI Amy Lamb presents for constipation  Has been chronic for her - usually goes 3-4 days without BM. However, over the past month, has been 4-5 days consistently. Has tried a number of OTC products but notes that dulcolax and magnesium citrate have been the only two to work Occ dark stool, but usually normal brown color Bristol Type 3.  Hx of hemorrhoids but does not feel these have been particularly bad lately despite some straining when using the restroom.  No lightheadedness, dizziness, weakness. No changes to diet or activity. No new or major stressors. Does report some fatigue.  Past Medical History:  Diagnosis Date  . Hypertension     Past Surgical History:  Procedure Laterality Date  . SPINAL FUSION  09/2018    Family History  Problem Relation Age of Onset  . Diabetes Mother   . Hyperlipidemia Mother   . Hypertension Mother   . Heart disease Maternal Grandmother   . Hyperlipidemia Maternal Grandmother   . Hypertension Maternal Grandmother     Social History   Socioeconomic History  . Marital status: Single    Spouse name: Not on file  . Number of children: 0  . Years of education: Not on file  . Highest education level: Not on file  Occupational History  . Not on file  Tobacco Use  . Smoking status: Never Smoker  . Smokeless tobacco: Never Used  Vaping Use  . Vaping Use: Never used  Substance and Sexual Activity  . Alcohol use: Yes    Comment: occasionally  . Drug use: No  . Sexual activity: Not Currently   Other Topics Concern  . Not on file  Social History Narrative  . Not on file   Social Determinants of Health   Financial Resource Strain:   . Difficulty of Paying Living Expenses: Not on file  Food Insecurity:   . Worried About Charity fundraiser in the Last Year: Not on file  . Ran Out of Food in the Last Year: Not on file  Transportation Needs:   . Lack of Transportation (Medical): Not on file  . Lack of Transportation (Non-Medical): Not on file  Physical Activity:   . Days of Exercise per Week: Not on file  . Minutes of Exercise per Session: Not on file  Stress:   . Feeling of Stress : Not on file  Social Connections:   . Frequency of Communication with Friends and Family: Not on file  . Frequency of Social Gatherings with Friends and Family: Not on file  . Attends Religious Services: Not on file  . Active Member of Clubs or Organizations: Not on file  . Attends Archivist Meetings: Not on file  . Marital Status: Not on file  Intimate Partner Violence:   . Fear of Current or Ex-Partner: Not on file  . Emotionally Abused: Not on file  . Physically Abused: Not on file  . Sexually Abused: Not on file  Outpatient Medications Prior to Visit  Medication Sig Dispense Refill  . cyanocobalamin 100 MCG tablet Take 100 mcg by mouth daily.    . cyclobenzaprine (FLEXERIL) 5 MG tablet TK 1 T PO QD    . Enalapril-hydroCHLOROthiazide 5-12.5 MG tablet Take 1 tablet by mouth daily. 90 tablet 3  . meloxicam (MOBIC) 7.5 MG tablet TK 1 T PO BID    . Multiple Vitamins-Minerals (HAIR SKIN AND NAILS FORMULA PO) Take by mouth.    Marland Kitchen VITAMIN D, CHOLECALCIFEROL, PO Take by mouth.    . sulfamethoxazole-trimethoprim (BACTRIM DS) 800-160 MG tablet Take 1 tablet by mouth 2 (two) times daily. (Patient not taking: Reported on 08/11/2020) 6 tablet 0   No facility-administered medications prior to visit.    No Known Allergies  ROS Review of Systems  Constitutional: Positive for  fatigue.  HENT: Negative.   Eyes: Negative.   Respiratory: Negative.   Cardiovascular: Negative.   Gastrointestinal: Positive for constipation. Negative for abdominal distention, abdominal pain, anal bleeding, blood in stool, diarrhea, nausea, rectal pain and vomiting.  Endocrine: Negative.   Genitourinary: Negative.   Musculoskeletal: Negative.   Skin: Negative.   Allergic/Immunologic: Negative.   Neurological: Negative.   Hematological: Negative.   Psychiatric/Behavioral: Negative.   All other systems reviewed and are negative.     Objective:    Physical Exam Constitutional:      General: She is not in acute distress.    Appearance: Normal appearance. She is obese. She is not ill-appearing, toxic-appearing or diaphoretic.  Cardiovascular:     Rate and Rhythm: Normal rate and regular rhythm.  Pulmonary:     Effort: Pulmonary effort is normal. No respiratory distress.  Abdominal:     General: Abdomen is flat. Bowel sounds are decreased. There is no distension or abdominal bruit. There are no signs of injury.     Palpations: Abdomen is soft. There is no shifting dullness, hepatomegaly, mass or pulsatile mass.     Tenderness: There is no abdominal tenderness. Negative signs include Murphy's sign, Rovsing's sign, McBurney's sign and psoas sign.     Hernia: No hernia is present.  Neurological:     Mental Status: She is alert.  Psychiatric:        Mood and Affect: Mood normal.        Behavior: Behavior normal.        Thought Content: Thought content normal.        Judgment: Judgment normal.     BP 132/81   Pulse 90   Temp 98 F (36.7 C) (Temporal)   Resp 18   Ht 5\' 5"  (1.651 m)   Wt 211 lb 3.2 oz (95.8 kg)   SpO2 97%   BMI 35.15 kg/m  Wt Readings from Last 3 Encounters:  08/11/20 211 lb 3.2 oz (95.8 kg)  05/05/20 216 lb (98 kg)  04/03/20 214 lb (97.1 kg)     There are no preventive care reminders to display for this patient.  There are no preventive care  reminders to display for this patient.  Lab Results  Component Value Date   TSH 0.738 05/05/2020   Lab Results  Component Value Date   WBC 4.9 05/05/2020   HGB 12.6 05/05/2020   HCT 37.7 05/05/2020   MCV 89 05/05/2020   PLT 299 10/14/2010   Lab Results  Component Value Date   NA 137 05/05/2020   K 4.0 05/05/2020   CO2 24 05/05/2020   GLUCOSE 95 05/05/2020   BUN  11 05/05/2020   CREATININE 0.79 05/05/2020   BILITOT <0.2 05/05/2020   ALKPHOS 72 05/05/2020   AST 15 05/05/2020   ALT 11 05/05/2020   PROT 7.6 05/05/2020   ALBUMIN 4.2 05/05/2020   CALCIUM 10.1 05/05/2020   Lab Results  Component Value Date   CHOL 203 (H) 05/05/2020   Lab Results  Component Value Date   HDL 61 05/05/2020   Lab Results  Component Value Date   LDLCALC 130 (H) 05/05/2020   Lab Results  Component Value Date   TRIG 67 05/05/2020   Lab Results  Component Value Date   CHOLHDL 3.3 05/05/2020   Lab Results  Component Value Date   HGBA1C 5.5 05/05/2020      Assessment & Plan:   Problem List Items Addressed This Visit    None    Visit Diagnoses    Chronic idiopathic constipation    -  Primary   Relevant Orders   TSH   Hemoglobin A1c   CBC      No orders of the defined types were placed in this encounter.   Follow-up: No follow-ups on file.   PLAN  Hypoactive bowel sounds, otherwise unremarkable exam. May be IBS-C? But given hx of hemorrhoids and sudden progression of symptoms, will refer to GI for further work up  [employer] today, will follow up as warranted  Patient encouraged to call clinic with any questions, comments, or concerns.  Maximiano Coss, NP

## 2020-08-11 NOTE — Patient Instructions (Signed)
° ° ° °  If you have lab work done today you will be contacted with your lab results within the next 2 weeks.  If you have not heard from us then please contact us. The fastest way to get your results is to register for My Chart. ° ° °IF you received an x-ray today, you will receive an invoice from [facility]. Please contact [facility] at [phone] with questions or concerns regarding your invoice.  ° °IF you received labwork today, you will receive an invoice from [facility]. Please contact [facility] at [phone] with questions or concerns regarding your invoice.  ° °Our billing staff will not be able to assist you with questions regarding bills from these companies. ° °You will be contacted with the lab results as soon as they are available. The fastest way to get your results is to activate your My Chart account. Instructions are located on the last page of this paperwork. If you have not heard from us regarding the results in 2 weeks, please contact this office. °  ° ° ° °

## 2020-08-12 LAB — CBC
Hematocrit: 38.8 % (ref 34.0–46.6)
Hemoglobin: 12.8 g/dL (ref 11.1–15.9)
MCH: 29.2 pg (ref 26.6–33.0)
MCHC: 33 g/dL (ref 31.5–35.7)
MCV: 89 fL (ref 79–97)
Platelets: 349 10*3/uL (ref 150–450)
RBC: 4.38 x10E6/uL (ref 3.77–5.28)
RDW: 13 % (ref 11.7–15.4)
WBC: 4.7 10*3/uL (ref 3.4–10.8)

## 2020-08-12 LAB — HEMOGLOBIN A1C
Est. average glucose Bld gHb Est-mCnc: 120 mg/dL
Hgb A1c MFr Bld: 5.8 % — ABNORMAL HIGH (ref 4.8–5.6)

## 2020-08-12 LAB — TSH: TSH: 1.16 u[IU]/mL (ref 0.450–4.500)

## 2020-08-14 ENCOUNTER — Encounter: Payer: Self-pay | Admitting: Nurse Practitioner

## 2020-08-20 ENCOUNTER — Encounter: Payer: Self-pay | Admitting: Registered Nurse

## 2020-08-23 NOTE — Progress Notes (Signed)
Letter SENT

## 2020-09-02 ENCOUNTER — Other Ambulatory Visit: Payer: Self-pay

## 2020-09-02 ENCOUNTER — Ambulatory Visit (HOSPITAL_COMMUNITY)
Admission: EM | Admit: 2020-09-02 | Discharge: 2020-09-02 | Disposition: A | Discharge status: Home / Self Care | Payer: BC Managed Care – PPO

## 2020-09-02 ENCOUNTER — Encounter (HOSPITAL_COMMUNITY): Payer: Self-pay | Admitting: *Deleted

## 2020-09-02 DIAGNOSIS — S025XXA Fracture of tooth (traumatic), initial encounter for closed fracture: Secondary | ICD-10-CM | POA: Diagnosis not present

## 2020-09-02 DIAGNOSIS — K047 Periapical abscess without sinus: Secondary | ICD-10-CM

## 2020-09-02 MED ORDER — IBUPROFEN 800 MG PO TABS: 800.0000 mg | ORAL_TABLET | Freq: Three times a day (TID) | ORAL | 0 refills | Status: AC

## 2020-09-02 MED ORDER — AMOXICILLIN 875 MG PO TABS
875.0000 mg | ORAL_TABLET | Freq: Two times a day (BID) | ORAL | 0 refills | Status: AC
Start: 2020-09-02 — End: 2020-09-09

## 2020-09-02 NOTE — Discharge Instructions (Signed)
Take medication as prescribed.  Follow-up with urgent tooth to see if they can get you worked in sooner to have the tooth removed.

## 2020-09-02 NOTE — ED Triage Notes (Signed)
C/o cracked left lower toothache x approx 1 month with progressive worsening.  No c/o pain radiating into left ear and down left lateral neck.  Denies fevers.

## 2020-09-02 NOTE — ED Provider Notes (Signed)
[place]    CSN: [id number] Arrival date & time: 09/02/20  1523      History   Chief Complaint Chief Complaint  Patient presents with   Dental Pain    HPI Amy Lamb is a 44 y.o. female.   HPI  Patient presents with dental pain involving the lower left molar tooth which was cracked several weeks ago.  She reports increasing pain for more than a month and now has pain involving the left General radiating to her left ear.  She has a dentist appointment next month they are unable to get her in sooner.  She did ask fever or inability to swallow.  Past Medical History:  Diagnosis Date   Hypertension     Patient Active Problem List   Diagnosis Date Noted   Prediabetes 08/01/2018   Essential hypertension 08/01/2018    Past Surgical History:  Procedure Laterality Date   BACK SURGERY     SPINAL FUSION  09/2018    OB History   No obstetric history on file.      Home Medications    Prior to Admission medications   Medication Sig Start Date End Date Taking? Authorizing Provider  Acetaminophen (TYLENOL PO) Take by mouth.   Yes [provider]  Enalapril-hydroCHLOROthiazide 5-12.5 MG tablet Take 1 tablet by mouth daily. 05/05/20  Yes Maximiano Coss, NP  cyanocobalamin 100 MCG tablet Take 100 mcg by mouth daily.    [provider]  cyclobenzaprine (FLEXERIL) 5 MG tablet TK 1 T PO QD 04/09/19   [provider]  meloxicam (MOBIC) 7.5 MG tablet TK 1 T PO BID 04/11/19   [provider]  Multiple Vitamins-Minerals (HAIR SKIN AND NAILS FORMULA PO) Take by mouth.    [provider]  VITAMIN D, CHOLECALCIFEROL, PO Take by mouth.    [provider]    Family History Family History  Problem Relation Age of Onset   Diabetes Mother    Hyperlipidemia Mother    Hypertension Mother    Heart disease Maternal Grandmother    Hyperlipidemia Maternal Grandmother    Hypertension Maternal Grandmother      Social History Social History   Tobacco Use   Smoking status: Never Smoker   Smokeless tobacco: Never Used  Scientific laboratory technician Use: Never used  Substance Use Topics   Alcohol use: Yes    Comment: occasionally   Drug use: No     Allergies   Patient has no known allergies.   Review of Systems Review of Systems Pertinent negatives listed in HPI Physical Exam Triage Vital Signs ED Triage Vitals [09/02/20 1737]  Enc Vitals Group     BP (!) 130/58     Pulse Rate 72     Resp 14     Temp 98.8 F (37.1 C)     Temp Source Oral     SpO2 100 %     Weight      Height      Head Circumference      Peak Flow      Pain Score 5     Pain Loc      Pain Edu?      Excl. in [place]?    No data found.  Updated Vital Signs BP (!) 130/58    Pulse 72    Temp 98.8 F (37.1 C) (Oral)    Resp 14    LMP 08/28/2020 (Exact Date)    SpO2 100%  Visual Acuity Right Eye Distance:   Left Eye Distance:   Bilateral Distance:    Right Eye Near:   Left Eye Near:    Bilateral Near:     Physical Exam Constitutional:      General: She is in acute distress.  HENT:     Mouth/Throat:     Dentition: Abnormal dentition. Gingival swelling and dental caries present. No dental abscesses.     Comments: Broken molar tooth lower right  Mild trismus with exam of oral mucosa  Cardiovascular:     Rate and Rhythm: Normal rate and regular rhythm.  Pulmonary:     Effort: Pulmonary effort is normal.     Breath sounds: Normal breath sounds.  Neurological:     Mental Status: She is alert.    UC Treatments / Results  Labs (all labs ordered are listed, but only abnormal results are displayed) Labs Reviewed - No data to display  EKG   Radiology No results found.  Procedures Procedures (including critical care time)  Medications Ordered in UC Medications - No data to display  Initial Impression / Assessment and Plan / UC Course  I have reviewed the triage vital signs and the nursing  notes.  Pertinent labs & imaging results that were available during my care of the patient were reviewed by me and considered in my medical decision making (see chart for details).    Acute dental infection and fractured tooth. Provided dental contact follow-up information. See outpatient treatment orders below. Final Clinical Impressions(s) / UC Diagnoses   Final diagnoses:  Closed fracture of tooth, initial encounter  Dental infection     Discharge Instructions     Take medication as prescribed.  Follow-up with urgent tooth to see if they can get you worked in sooner to have the tooth removed.    ED Prescriptions    Medication Sig Dispense Auth. Provider   ibuprofen (ADVIL) 800 MG tablet Take 1 tablet (800 mg total) by mouth 3 (three) times daily. 21 tablet Scot Jun, FNP   amoxicillin (AMOXIL) 875 MG tablet Take 1 tablet (875 mg total) by mouth 2 (two) times daily for 7 days. 14 tablet Scot Jun, FNP     PDMP not reviewed this encounter.   Scot Jun, [place] 09/06/20 2217

## 2020-09-20 NOTE — Progress Notes (Signed)
09/20/2020 ELICA ALMAS [id number] 1976-05-11   CHIEF COMPLAINT: constipation   HISTORY OF PRESENT ILLNESS:  Amy Lamb is a 44 year old female with a past medical history of hypertension and chronic constipation. S/P spinal fusion 09/2018. She was referred to our office by Maximiano Coss NP for further evaluation regarding chronic constipation. She reports having constipation throughout her adulthood. She can go 3 days without passing a BM which was her typically bowel pattern. Starting in  June 2021 she went 5 to 6 days without passing a BM. She took Metamucil and she had less straining and more frequent BMs for one week then it stopped working so she discontinued it. She is taking Dulcolax 1 tab every 3 to 4 days which results in passing a large amount of brown formed stool. She continue to strain. She reports having 2 small hemorrhoids. No rectal bleeding. She sometimes passes a lot of gas from the rectum. She has a low energy level when constipated. She drinks at least 6 glasses of water daily. Limited fruit and vegetable intake. No weight loss. No family history of colon cancer. She is taking Ibuprofen 800mg  po tid with food for the past few days due to have dental pain. She intends to have wisdom teeth extraction surgery in the near future. She takes Meloxicam as needed for arthritis. She is aware not to take Meloxicam when taking Ibuprofen. No GERD symptoms. No other complaints today.    CBC Latest Ref Rng & Units 08/11/2020 05/05/2020 10/14/2010  WBC 3.4 - 10.8 x10E3/uL 4.7 4.9 5.5  Hemoglobin 11.1 - 15.9 g/dL 12.8 12.6 11.4(L)  Hematocrit 34.0 - 46.6 % 38.8 37.7 35.5(L)  Platelets 150 - 450 x10E3/uL 349 - 299     CMP Latest Ref Rng & Units 05/05/2020 10/14/2010  Glucose 65 - 99 mg/dL 95 82  BUN 6 - 24 mg/dL 11 13  Creatinine 0.57 - 1.00 mg/dL 0.79 0.52  Sodium 134 - 144 mmol/L 137 136  Potassium 3.5 - 5.2 mmol/L 4.0 4.0  Chloride 96 - 106 mmol/L 99 104  CO2 20 - 29  mmol/L 24 27  Calcium 8.7 - 10.2 mg/dL 10.1 9.3  Total Protein 6.0 - 8.5 g/dL 7.6 -  Total Bilirubin 0.0 - 1.2 mg/dL <0.2 -  Alkaline Phos 48 - 121 IU/L 72 -  AST 0 - 40 IU/L 15 -  ALT 0 - 32 IU/L 11 -  TSH 1.16.   Past Medical History:  Diagnosis Date  . Hypertension    Past Surgical History:  Procedure Laterality Date  . BACK SURGERY    . SPINAL FUSION  09/2018    Social History: She is single. She works in Therapist, art. Nonsmoker. Infrequent alcohol use. No history of drug use.   Family History: Mother age 71 DM, HTN. Father 18 history unknown. Maternal grandmother with heart disease, died from MI.Paternal grandmother GERD and arthritis. Sister HTN. Sister healthy.    No Known Allergies    Outpatient Encounter Medications as of 09/21/2020  Medication Sig  . Acetaminophen (TYLENOL PO) Take by mouth.  . cyanocobalamin 100 MCG tablet Take 100 mcg by mouth daily.  . cyclobenzaprine (FLEXERIL) 5 MG tablet TK 1 T PO QD  . Enalapril-hydroCHLOROthiazide 5-12.5 MG tablet Take 1 tablet by mouth daily.  Marland Kitchen ibuprofen (ADVIL) 800 MG tablet Take 1 tablet (800 mg total) by mouth 3 (three) times daily.  . meloxicam (MOBIC) 7.5 MG tablet TK 1 T PO BID  .  Multiple Vitamins-Minerals (HAIR SKIN AND NAILS FORMULA PO) Take by mouth.  Marland Kitchen VITAMIN D, CHOLECALCIFEROL, PO Take by mouth.   No facility-administered encounter medications on file as of 09/21/2020.     REVIEW OF SYSTEMS:  Gen: Denies fever, sweats or chills. No weight loss.  CV: Denies chest pain, palpitations or edema. Resp: Denies cough, shortness of breath of hemoptysis.  GI: See HPI.  GU : Denies urinary burning, blood in urine, increased urinary frequency or incontinence. MS: Denies joint pain, muscles aches or weakness. Derm: Denies rash, itchiness, skin lesions or unhealing ulcers. Psych: Denies depression or anxiety.  Heme: Denies bruising, bleeding. Neuro:  Denies headaches, dizziness or paresthesias. Endo:  Denies  any problems with DM, thyroid or adrenal function.  PHYSICAL EXAM: LMP 08/28/2020 (Exact Date)  General: Well developed  44 year old female in no acute distress. Head: Normocephalic and atraumatic. Eyes:  Sclerae non-icteric, conjunctive pink. Ears: Normal auditory acuity. Mouth: Dentition intact. No ulcers or lesions.  Neck: Supple, no lymphadenopathy or thyromegaly.  Lungs: Clear bilaterally to auscultation without wheezes, crackles or rhonchi. Heart: Regular rate and rhythm. No murmur, rub or gallop appreciated.  Abdomen: Soft, nontender, non distended. No masses. No hepatosplenomegaly. Normoactive bowel sounds x 4 quadrants.  Rectal: Deferred.  Musculoskeletal: Symmetrical with no gross deformities. Skin: Warm and dry. No rash or lesions on visible extremities. Extremities: No edema. Neurological: Alert oriented x 4, no focal deficits.  Psychological:  Alert and cooperative. Normal mood and affect.  ASSESSMENT AND PLAN:  87. 44 year old female with chronic constipation -Miralax Q HS -Dulcolax 1 tab Q 3 nights as needed -Will try Linzess if the above regimen ineffective  -Patient to call our office if her symptoms worsen   2. Screening colonoscopy  -Colonoscopy benefits and risks discussed including risk with sedation, risk of bleeding, perforation and infection  -Patient to take 2 Dulcolax tabs night before colonoscopy prep day         CC:  Maximiano Coss, NP

## 2020-09-21 ENCOUNTER — Encounter: Payer: Self-pay | Admitting: Nurse Practitioner

## 2020-09-21 ENCOUNTER — Ambulatory Visit: Payer: BC Managed Care – PPO | Admitting: Nurse Practitioner

## 2020-09-21 VITALS — BP 120/86 | HR 92 | Ht 64.0 in | Wt 213.0 lb

## 2020-09-21 DIAGNOSIS — K5900 Constipation, unspecified: Secondary | ICD-10-CM

## 2020-09-21 DIAGNOSIS — Z1211 Encounter for screening for malignant neoplasm of colon: Secondary | ICD-10-CM

## 2020-09-21 DIAGNOSIS — K5909 Other constipation: Secondary | ICD-10-CM | POA: Diagnosis not present

## 2020-09-21 MED ORDER — SUTAB 1479-225-188 MG PO TABS: 1.0000 | ORAL_TABLET | ORAL | 0 refills | Status: DC

## 2020-09-21 NOTE — Progress Notes (Signed)
Reviewed and agree with management plan.  Sabastion Hrdlicka T. Brownie Gockel, MD FACG [facility]  

## 2020-09-21 NOTE — Patient Instructions (Addendum)
You have been scheduled for a colonoscopy. Please follow written instructions given to you at your visit today.  Please pick up your prep supplies at the pharmacy within the next 1-3 days. If you use inhalers (even only as needed), please bring them with you on the day of your procedure. ________________________________________________________________  Please purchase the following medications over the counter and take as directed: Miralax 17 grams (1 capful) dissolved in at least 8 ounces water/juice once daily.  Dulcolax 1 tablet every 3-4 nights as needed.  ________________________________________________________________  Please follow a high fiber diet (see below).  ________________________________________________________________  If you are age 46 or older, your body mass index should be between 23-30. Your Body mass index is 36.56 kg/m. If this is out of the aforementioned range listed, please consider follow up with your Primary Care Provider.  If you are age 46 or younger, your body mass index should be between 19-25. Your Body mass index is 36.56 kg/m. If this is out of the aformentioned range listed, please consider follow up with your Primary Care Provider.   ________________________________________________________________  Due to recent changes in healthcare laws, you may see the results of your imaging and laboratory studies on MyChart before your provider has had a chance to review them.  We understand that in some cases there may be results that are confusing or concerning to you. Not all laboratory results come back in the same time frame and the provider may be waiting for multiple results in order to interpret others.  Please give [place] 48 hours in order for your provider to thoroughly review all the results before contacting the office for clarification of your results.   ________________________________________________________________   High-Fiber Diet Fiber, also  called dietary fiber, is a type of carbohydrate that is found in fruits, vegetables, whole grains, and beans. A high-fiber diet can have many health benefits. Your health care provider may recommend a high-fiber diet to help:  Prevent constipation. Fiber can make your bowel movements more regular.  Lower your cholesterol.  Relieve the following conditions: ? Swelling of veins in the anus (hemorrhoids). ? Swelling and irritation (inflammation) of specific areas of the digestive tract (uncomplicated diverticulosis). ? A problem of the large intestine (colon) that sometimes causes pain and diarrhea (irritable bowel syndrome, IBS).  Prevent overeating as part of a weight-loss plan.  Prevent heart disease, type 2 diabetes, and certain cancers. What is my plan? The recommended daily fiber intake in grams (g) includes:  38 g for men age 40 or younger.  30 g for men over age 8.  27 g for women age 83 or younger.  21 g for women over age 68. You can get the recommended daily intake of dietary fiber by:  Eating a variety of fruits, vegetables, grains, and beans.  Taking a fiber supplement, if it is not possible to get enough fiber through your diet. What do I need to know about a high-fiber diet?  It is better to get fiber through food sources rather than from fiber supplements. There is not a lot of research about how effective supplements are.  Always check the fiber content on the nutrition facts label of any prepackaged food. Look for foods that contain 5 g of fiber or more per serving.  Talk with a diet and nutrition specialist (dietitian) if you have questions about specific foods that are recommended or not recommended for your medical condition, especially if those foods are not listed below.  Gradually increase  how much fiber you consume. If you increase your intake of dietary fiber too quickly, you may have bloating, cramping, or gas.  Drink plenty of water. Water helps you to  digest fiber. What are tips for following this plan?  Eat a wide variety of high-fiber foods.  Make sure that half of the grains that you eat each day are whole grains.  Eat breads and cereals that are made with whole-grain flour instead of refined flour or white flour.  Eat brown rice, bulgur wheat, or millet instead of white rice.  Start the day with a breakfast that is high in fiber, such as a cereal that contains 5 g of fiber or more per serving.  Use beans in place of meat in soups, salads, and pasta dishes.  Eat high-fiber snacks, such as berries, raw vegetables, nuts, and popcorn.  Choose whole fruits and vegetables instead of processed forms like juice or sauce. What foods can I eat?  Fruits Berries. Pears. Apples. Oranges. Avocado. Prunes and raisins. Dried figs. Vegetables Sweet potatoes. Spinach. Kale. Artichokes. Cabbage. Broccoli. Cauliflower. Green peas. Carrots. Squash. Grains Whole-grain breads. Multigrain cereal. Oats and oatmeal. Brown rice. Barley. Bulgur wheat. [place]. Quinoa. Bran muffins. Popcorn. Rye wafer crackers. Meats and other proteins Navy, kidney, and pinto beans. Soybeans. Split peas. Lentils. Nuts and seeds. Dairy Fiber-fortified yogurt. Beverages Fiber-fortified soy milk. Fiber-fortified orange juice. Other foods Fiber bars. The items listed above may not be a complete list of recommended foods and beverages. Contact a dietitian for more options. What foods are not recommended? Fruits Fruit juice. Cooked, strained fruit. Vegetables Fried potatoes. Canned vegetables. Well-cooked vegetables. Grains White bread. Pasta made with refined flour. White rice. Meats and other proteins Fatty cuts of meat. Fried chicken or fried fish. Dairy Milk. Yogurt. Cream cheese. Sour cream. Fats and oils Butters. Beverages Soft drinks. Other foods Cakes and pastries. The items listed above may not be a complete list of foods and beverages to avoid.  Contact a dietitian for more information. Summary  Fiber is a type of carbohydrate. It is found in fruits, vegetables, whole grains, and beans.  There are many health benefits of eating a high-fiber diet, such as preventing constipation, lowering blood cholesterol, helping with weight loss, and reducing your risk of heart disease, diabetes, and certain cancers.  Gradually increase your intake of fiber. Increasing too fast can result in cramping, bloating, and gas. Drink plenty of water while you increase your fiber.  The best sources of fiber include whole fruits and vegetables, whole grains, nuts, seeds, and beans. This information is not intended to replace advice given to you by your health care provider. Make sure you discuss any questions you have with your health care provider. Document Revised: 10/06/2017 Document Reviewed: 10/06/2017 Elsevier Patient Education  2020 [employer].

## 2020-10-12 ENCOUNTER — Telehealth: Payer: Self-pay | Admitting: Gastroenterology

## 2020-10-12 NOTE — Telephone Encounter (Signed)
I have spoken to patient, rx was run back through and is now 50 dollars at [facility]. Patient verbalizes understanding.

## 2020-10-12 NOTE — Telephone Encounter (Signed)
Pt states that [facility] did not want to honor coupon for [facility]. Pt is asking if prescription can be sent to [facility] on [address]. She wants to see if they would take coupon.

## 2020-10-27 ENCOUNTER — Encounter: Payer: BC Managed Care – PPO | Admitting: Gastroenterology

## 2020-11-17 ENCOUNTER — Ambulatory Visit (AMBULATORY_SURGERY_CENTER): Payer: BC Managed Care – PPO | Admitting: Gastroenterology

## 2020-11-17 ENCOUNTER — Encounter: Payer: Self-pay | Admitting: Gastroenterology

## 2020-11-17 ENCOUNTER — Other Ambulatory Visit: Payer: Self-pay

## 2020-11-17 VITALS — BP 105/68 | HR 81 | Temp 98.0°F | Resp 18 | Ht 64.0 in | Wt 213.0 lb

## 2020-11-17 DIAGNOSIS — Z1211 Encounter for screening for malignant neoplasm of colon: Secondary | ICD-10-CM

## 2020-11-17 DIAGNOSIS — K635 Polyp of colon: Secondary | ICD-10-CM

## 2020-11-17 DIAGNOSIS — D125 Benign neoplasm of sigmoid colon: Secondary | ICD-10-CM

## 2020-11-17 MED ORDER — SODIUM CHLORIDE 0.9 % IV SOLN: 500.0000 mL | INTRAVENOUS | Status: DC

## 2020-11-17 MED ORDER — LINACLOTIDE 145 MCG PO CAPS: 145.0000 ug | ORAL_CAPSULE | Freq: Every day | ORAL | 12 refills | Status: DC

## 2020-11-17 NOTE — Progress Notes (Signed)
Reviewed medical/surgical history with patient and noted any changes since office visit.

## 2020-11-17 NOTE — Progress Notes (Signed)
To PACU, VSS. Report to [email or web address] 

## 2020-11-17 NOTE — Patient Instructions (Signed)
Handout on polyps, hemorrhoids, diverticulosis and high fiber given.    YOU HAD AN ENDOSCOPIC PROCEDURE TODAY AT [facility]:   Refer to the procedure report that was given to you for any specific questions about what was found during the examination.  If the procedure report does not answer your questions, please call your gastroenterologist to clarify.  If you requested that your care partner not be given the details of your procedure findings, then the procedure report has been included in a sealed envelope for you to review at your convenience later.  YOU SHOULD EXPECT: Some feelings of bloating in the abdomen. Passage of more gas than usual.  Walking can help get rid of the air that was put into your GI tract during the procedure and reduce the bloating. If you had a lower endoscopy (such as a colonoscopy or flexible sigmoidoscopy) you may notice spotting of blood in your stool or on the toilet paper. If you underwent a bowel prep for your procedure, you may not have a normal bowel movement for a few days.  Please Note:  You might notice some irritation and congestion in your nose or some drainage.  This is from the oxygen used during your procedure.  There is no need for concern and it should clear up in a day or so.  SYMPTOMS TO REPORT IMMEDIATELY:   Following lower endoscopy (colonoscopy or flexible sigmoidoscopy):  Excessive amounts of blood in the stool  Significant tenderness or worsening of abdominal pains  Swelling of the abdomen that is new, acute  Fever of 100F or higher  For urgent or emergent issues, a gastroenterologist can be reached at any hour by calling [phone]. Do not use MyChart messaging for urgent concerns.    DIET:  We do recommend a small meal at first, but then you may proceed to your regular diet.  Drink plenty of fluids but you should avoid alcoholic beverages for 24 hours.  ACTIVITY:  You should plan to take it easy for the rest of  today and you should NOT DRIVE or use heavy machinery until tomorrow (because of the sedation medicines used during the test).    FOLLOW UP: Our staff will call the number listed on your records 48-72 hours following your procedure to check on you and address any questions or concerns that you may have regarding the information given to you following your procedure. If we do not reach you, we will leave a message.  We will attempt to reach you two times.  During this call, we will ask if you have developed any symptoms of COVID 19. If you develop any symptoms (ie: fever, flu-like symptoms, shortness of breath, cough etc.) before then, please call [phone].  If you test positive for Covid 19 in the 2 weeks post procedure, please call and report this information to [place].    If any biopsies were taken you will be contacted by phone or by letter within the next 1-3 weeks.  Please call us at [phone] if you have not heard about the biopsies in 3 weeks.    SIGNATURES/CONFIDENTIALITY: You and/or your care partner have signed paperwork which will be entered into your electronic medical record.  These signatures attest to the fact that that the information above on your After Visit Summary has been reviewed and is understood.  Full responsibility of the confidentiality of this discharge information lies with you and/or your care-partner.

## 2020-11-17 NOTE — Op Note (Signed)
[place] Patient Name: Amy Lamb Procedure Date: 11/17/2020 4:46 PM MRN: [id number] Endoscopist: Ladene Artist , MD Age: 44 Referring MD:  Date of Birth: 10/27/1976 Gender: Female Account #: [id number] Procedure:                Colonoscopy Indications:              Screening for colorectal malignant neoplasm Medicines:                Monitored Anesthesia Care Procedure:                Pre-Anesthesia Assessment:                           - Prior to the procedure, a History and Physical                            was performed, and patient medications and                            allergies were reviewed. The patient's tolerance of                            previous anesthesia was also reviewed. The risks                            and benefits of the procedure and the sedation                            options and risks were discussed with the patient.                            All questions were answered, and informed consent                            was obtained. Prior Anticoagulants: The patient has                            taken no previous anticoagulant or antiplatelet                            agents. ASA Grade Assessment: II - A patient with                            mild systemic disease. After reviewing the risks                            and benefits, the patient was deemed in                            satisfactory condition to undergo the procedure.                           After obtaining informed consent, the colonoscope  was passed under direct vision. Throughout the                            procedure, the patient's blood pressure, pulse, and                            oxygen saturations were monitored continuously. The                            Colonoscope was introduced through the anus and                            advanced to the the cecum, identified by                            appendiceal orifice and  ileocecal valve. The                            ileocecal valve, appendiceal orifice, and rectum                            were photographed. The quality of the bowel                            preparation was excellent. The colonoscopy was                            somewhat difficult due to significant looping and a                            tortuous colon. The patient tolerated the procedure                            well. Scope In: 4:52:02 PM Scope Out: 5:11:04 PM Scope Withdrawal Time: 0 hours 10 minutes 56 seconds  Total Procedure Duration: 0 hours 19 minutes 2 seconds  Findings:                 The perianal and digital rectal examinations were                            normal.                           A 6 mm polyp was found in the sigmoid colon. The                            polyp was sessile. The polyp was removed with a                            cold snare. Resection and retrieval were complete.                           A few medium-mouthed diverticula were found in the  right colon. There was no evidence of diverticular                            bleeding.                           Internal hemorrhoids were found during                            retroflexion. The hemorrhoids were small and Grade                            I (internal hemorrhoids that do not prolapse).                           The exam was otherwise without abnormality on                            direct and retroflexion views. Complications:            No immediate complications. Estimated blood loss:                            None. Estimated Blood Loss:     Estimated blood loss: none. Impression:               - One 6 mm polyp in the sigmoid colon, removed with                            a cold snare. Resected and retrieved.                           - Mild diverticulosis in the right colon.                           - Internal hemorrhoids.                           -  The examination was otherwise normal on direct                            and retroflexion views. Recommendation:           - Repeat colonoscopy after studies are complete for                            surveillance based on pathology results.                           - Patient has a contact number available for                            emergencies. The signs and symptoms of potential                            delayed complications were discussed with the  patient. Return to normal activities tomorrow.                            Written discharge instructions were provided to the                            patient.                           - High fiber diet.                           - Continue present medications.                           - Await pathology results.                           - Linzess (linaclotide) 145 mcg PO daily, 1 year of                            refills.                           - Return to GI office in 6 weeks. Ladene Artist, MD 11/17/2020 5:15:05 PM This report has been signed electronically.

## 2020-11-17 NOTE — Progress Notes (Signed)
Called to room to assist during endoscopic procedure.  Patient ID and intended procedure confirmed with present staff. Received instructions for my participation in the procedure from the performing physician.  

## 2020-11-21 ENCOUNTER — Telehealth: Payer: Self-pay | Admitting: *Deleted

## 2020-11-21 NOTE — Telephone Encounter (Signed)
Follow up call made. 

## 2020-11-21 NOTE — Telephone Encounter (Signed)
  Follow up Call-  Call back number 11/17/2020  Post procedure Call Back phone  # [phone]  Permission to leave phone message Yes  Some recent data might be hidden     Patient questions:  Do you have a fever, pain , or abdominal swelling? No. Pain Score  0 *  Have you tolerated food without any problems? Yes.    Have you been able to return to your normal activities? Yes.    Do you have any questions about your discharge instructions: Diet   No. Medications  No. Follow up visit  No.  Do you have questions or concerns about your Care? No.  Actions: * If pain score is 4 or above: No action needed, pain <4.  1. Have you developed a fever since your procedure? no  2.   Have you had an respiratory symptoms (SOB or cough) since your procedure? no  3.   Have you tested positive for COVID 19 since your procedure no  4.   Have you had any family members/close contacts diagnosed with the COVID 19 since your procedure?  no   If yes to any of these questions please route to Joylene John, RN and Joella Prince, RN

## 2020-12-04 ENCOUNTER — Encounter: Payer: Self-pay | Admitting: Gastroenterology

## 2021-05-19 ENCOUNTER — Other Ambulatory Visit: Payer: Self-pay | Admitting: Registered Nurse

## 2021-05-19 DIAGNOSIS — I10 Essential (primary) hypertension: Secondary | ICD-10-CM

## 2021-05-20 NOTE — Telephone Encounter (Signed)
Pt needs OV 

## 2021-05-24 ENCOUNTER — Other Ambulatory Visit: Payer: Self-pay

## 2021-05-24 ENCOUNTER — Encounter: Payer: Self-pay | Admitting: Registered Nurse

## 2021-05-24 ENCOUNTER — Ambulatory Visit (INDEPENDENT_AMBULATORY_CARE_PROVIDER_SITE_OTHER): Payer: BC Managed Care – PPO | Admitting: Registered Nurse

## 2021-05-24 VITALS — BP 128/68 | HR 89 | Temp 98.0°F | Resp 18 | Ht 64.0 in | Wt 214.6 lb

## 2021-05-24 DIAGNOSIS — L509 Urticaria, unspecified: Secondary | ICD-10-CM

## 2021-05-24 MED ORDER — PREDNISONE 10 MG (21) PO TBPK
ORAL_TABLET | ORAL | 0 refills | Status: DC
Start: 2021-05-24 — End: 2022-09-27

## 2021-05-24 MED ORDER — CETIRIZINE HCL 10 MG PO TABS: 10.0000 mg | ORAL_TABLET | Freq: Every day | ORAL | 11 refills | Status: DC

## 2021-05-24 MED ORDER — TRIAMCINOLONE ACETONIDE 0.1 % EX CREA: 1.0000 "application " | TOPICAL_CREAM | Freq: Two times a day (BID) | CUTANEOUS | 0 refills | Status: DC

## 2021-05-24 NOTE — Progress Notes (Signed)
Acute Office Visit  Subjective:    Patient ID: Amy Lamb, female    DOB: September 07, 1976, 45 y.o.   MRN: [id number]  Chief Complaint  Patient presents with   Urticaria    Patient states for the past month she has been breaking out in hives all over. She states that the hives usually stay for about 3 days with som pain and itching. She has tried otc creams and medication     HPI Patient is in today for urticaria  Ongoing 1 mo, intermittent No clear triggers Disseminated across whole body Has tried a variety of OTC medications without relief Has not happened before Denies change to diet and exercise habits, hygiene products, etc. Does note that she had changed her fabric softener about 1 mo ago but has since changed it back and rewashed all her clothes and linens.    Past Medical History:  Diagnosis Date   Hypertension     Past Surgical History:  Procedure Laterality Date   BACK SURGERY     SPINAL FUSION  09/2018    Family History  Problem Relation Age of Onset   Diabetes Mother    Hyperlipidemia Mother    Hypertension Mother    Heart disease Maternal Grandmother    Hyperlipidemia Maternal Grandmother    Hypertension Maternal Grandmother    Colon cancer Neg Hx    Esophageal cancer Neg Hx    Stomach cancer Neg Hx    Rectal cancer Neg Hx     Social History   Socioeconomic History   Marital status: Single    Spouse name: Not on file   Number of children: 0   Years of education: Not on file   Highest education level: Not on file  Occupational History   Not on file  Tobacco Use   Smoking status: Never   Smokeless tobacco: Never  Vaping Use   Vaping Use: Never used  Substance and Sexual Activity   Alcohol use: Yes    Comment: occasionally   Drug use: No   Sexual activity: Yes  Other Topics Concern   Not on file  Social History Narrative   Not on file   Social Determinants of Health   Financial Resource Strain: Not on file  Food Insecurity: Not  on file  Transportation Needs: Not on file  Physical Activity: Not on file  Stress: Not on file  Social Connections: Not on file  Intimate Partner Violence: Not on file    Outpatient Medications Prior to Visit  Medication Sig Dispense Refill   Acetaminophen (TYLENOL PO) Take by mouth.     cyanocobalamin 100 MCG tablet Take 100 mcg by mouth daily.     cyclobenzaprine (FLEXERIL) 5 MG tablet TK 1 T PO QD     Enalapril-hydroCHLOROthiazide 5-12.5 MG tablet TAKE 1 TABLET BY MOUTH DAILY 90 tablet 0   ibuprofen (ADVIL) 800 MG tablet Take 1 tablet (800 mg total) by mouth 3 (three) times daily. 21 tablet 0   linaclotide (LINZESS) 145 MCG CAPS capsule Take 1 capsule (145 mcg total) by mouth daily before breakfast. 30 capsule 12   meloxicam (MOBIC) 7.5 MG tablet TK 1 T PO BID     Multiple Vitamins-Minerals (HAIR SKIN AND NAILS FORMULA PO) Take by mouth.     VITAMIN D, CHOLECALCIFEROL, PO Take by mouth.     No facility-administered medications prior to visit.    No Known Allergies  Review of Systems  Constitutional: Negative.   HENT: Negative.  Eyes: Negative.   Respiratory: Negative.    Cardiovascular: Negative.   Gastrointestinal: Negative.   Genitourinary: Negative.   Musculoskeletal: Negative.   Skin:  Positive for rash.  Neurological: Negative.   Psychiatric/Behavioral: Negative.        Objective:    Physical Exam Vitals and nursing note reviewed.  Constitutional:      General: She is not in acute distress.    Appearance: Normal appearance. She is not ill-appearing, toxic-appearing or diaphoretic.  Cardiovascular:     Rate and Rhythm: Normal rate and regular rhythm.     Pulses: Normal pulses.     Heart sounds: Normal heart sounds. No murmur heard.   No friction rub. No gallop.  Pulmonary:     Effort: Pulmonary effort is normal. No respiratory distress.     Breath sounds: Normal breath sounds. No stridor. No wheezing, rhonchi or rales.  Chest:     Chest wall: No  tenderness.  Skin:    General: Skin is warm and dry.     Capillary Refill: Capillary refill takes less than 2 seconds.     Findings: Rash (disseminated reddish rash) present.  Neurological:     General: No focal deficit present.     Mental Status: She is alert and oriented to person, place, and time. Mental status is at baseline.  Psychiatric:        Mood and Affect: Mood normal.        Behavior: Behavior normal.        Thought Content: Thought content normal.        Judgment: Judgment normal.    BP 128/68   Pulse 89   Temp 98 F (36.7 C) (Temporal)   Resp 18   Ht 5\' 4"  (1.626 m)   Wt 214 lb 9.6 oz (97.3 kg)   SpO2 99%   BMI 36.84 kg/m  Wt Readings from Last 3 Encounters:  05/24/21 214 lb 9.6 oz (97.3 kg)  11/17/20 213 lb (96.6 kg)  09/21/20 213 lb (96.6 kg)    There are no preventive care reminders to display for this patient.  There are no preventive care reminders to display for this patient.   Lab Results  Component Value Date   TSH 1.160 08/11/2020   Lab Results  Component Value Date   WBC 4.7 08/11/2020   HGB 12.8 08/11/2020   HCT 38.8 08/11/2020   MCV 89 08/11/2020   PLT 349 08/11/2020   Lab Results  Component Value Date   NA 137 05/05/2020   K 4.0 05/05/2020   CO2 24 05/05/2020   GLUCOSE 95 05/05/2020   BUN 11 05/05/2020   CREATININE 0.79 05/05/2020   BILITOT <0.2 05/05/2020   ALKPHOS 72 05/05/2020   AST 15 05/05/2020   ALT 11 05/05/2020   PROT 7.6 05/05/2020   ALBUMIN 4.2 05/05/2020   CALCIUM 10.1 05/05/2020   Lab Results  Component Value Date   CHOL 203 (H) 05/05/2020   Lab Results  Component Value Date   HDL 61 05/05/2020   Lab Results  Component Value Date   LDLCALC 130 (H) 05/05/2020   Lab Results  Component Value Date   TRIG 67 05/05/2020   Lab Results  Component Value Date   CHOLHDL 3.3 05/05/2020   Lab Results  Component Value Date   HGBA1C 5.8 (H) 08/11/2020       Assessment & Plan:   Problem List Items  Addressed This Visit   None Visit Diagnoses  Urticaria    -  Primary   Relevant Medications   predniSONE (STERAPRED UNI-PAK 21 TAB) 10 MG (21) TBPK tablet   triamcinolone cream (KENALOG) 0.1 %   cetirizine (ZYRTEC) 10 MG tablet        Meds ordered this encounter  Medications   predniSONE (STERAPRED UNI-PAK 21 TAB) 10 MG (21) TBPK tablet    Sig: Take per package instructions. Do not skip doses. Finish entire supply.    Dispense:  1 each    Refill:  0    Order Specific Question:   Supervising Provider    Answer:   Carlota Raspberry, JEFFREY R [[id number]]   triamcinolone cream (KENALOG) 0.1 %    Sig: Apply 1 application topically 2 (two) times daily.    Dispense:  30 g    Refill:  0    Order Specific Question:   Supervising Provider    Answer:   Carlota Raspberry, JEFFREY R [[id number]]   cetirizine (ZYRTEC) 10 MG tablet    Sig: Take 1 tablet (10 mg total) by mouth daily.    Dispense:  30 tablet    Refill:  11    Order Specific Question:   Supervising Provider    Answer:   Carlota Raspberry, JEFFREY R [[id number]]   PLAN Urticaria, unknown irritant. Sterapred, triamcinolone, and zyrtec If persistent or worsening can consider derm or allergy referrals Patient encouraged to call clinic with any questions, comments, or concerns.  Maximiano Coss, NP

## 2021-05-24 NOTE — Patient Instructions (Addendum)
Ms. Bertoni -   Take prednisone as directed. Take zyrtec daily. Use triamcinolone as needed on everywhere except face and groin  If symptoms persist or worsen, let me know We can consider allergy testing in the future.  Thank you  Rich     If you have lab work done today you will be contacted with your lab results within the next 2 weeks.  If you have not heard from [place] then please contact us. The fastest way to get your results is to register for My Chart.   IF you received an x-ray today, you will receive an invoice from [facility]. Please contact [facility] at [phone] with questions or concerns regarding your invoice.   IF you received labwork today, you will receive an invoice from [facility]. Please contact [facility] at [phone] with questions or concerns regarding your invoice.   Our billing staff will not be able to assist you with questions regarding bills from these companies.  You will be contacted with the lab results as soon as they are available. The fastest way to get your results is to activate your My Chart account. Instructions are located on the last page of this paperwork. If you have not heard from [place] regarding the results in 2 weeks, please contact this office.

## 2021-06-29 ENCOUNTER — Telehealth: Payer: Self-pay

## 2021-06-29 DIAGNOSIS — L509 Urticaria, unspecified: Secondary | ICD-10-CM

## 2021-06-29 NOTE — Telephone Encounter (Signed)
Patient called back. She states she is having hives, not as big as when she was seen, and itching. She states when she finished the prednisone it seems that her symptoms return. The note from 05/24/21 refers to allergy testing. Okay to place referral to allergist?

## 2021-06-29 NOTE — Telephone Encounter (Signed)
Pt needs refill on triamcinolone cream (KENALOG) 0.1 %, if possible another refill on the predniSONE (STERAPRED UNI-PAK 21 TAB) 10 MG (21) TBPK tablet [[id number]]    [id number] - [place], [place] [address]   pt is stating that she may need a refferal to a allergie specialist since she keeps breaking out in hives.    Pt call back [phone]

## 2021-06-29 NOTE — Telephone Encounter (Signed)
Pt needs refill on triamcinolone cream (KENALOG) 0.1 %, if possible another refill on the predniSONE (STERAPRED UNI-PAK 21 TAB) 10 MG (21) TBPK tablet [[id number]]     [id number] - [place], [place] - [place]    pt is stating that she may need a refferal to a allergie specialist since she keeps breaking out in hives.     Pt call back [phone]      Note   Patient seen and prescribed both of these meds on 05/24/21. Tried calling patient to information about what is going on but no answer.

## 2021-06-29 NOTE — Telephone Encounter (Signed)
Sent to provider for approval

## 2021-07-02 ENCOUNTER — Other Ambulatory Visit: Payer: Self-pay

## 2021-07-02 DIAGNOSIS — Z0182 Encounter for allergy testing: Secondary | ICD-10-CM

## 2021-07-02 DIAGNOSIS — L509 Urticaria, unspecified: Secondary | ICD-10-CM

## 2021-07-02 NOTE — Telephone Encounter (Signed)
Patient called back stating that she really needs a refill on her cream - She says she can't see the dermatologist for a couple of months.  Please advise

## 2021-07-05 NOTE — Telephone Encounter (Signed)
Yes - no refill to prednisone  Thank you  Rich

## 2021-07-09 ENCOUNTER — Other Ambulatory Visit: Payer: Self-pay

## 2021-07-09 DIAGNOSIS — L509 Urticaria, unspecified: Secondary | ICD-10-CM

## 2021-07-09 MED ORDER — TRIAMCINOLONE ACETONIDE 0.1 % EX CREA: 1.0000 "application " | TOPICAL_CREAM | Freq: Two times a day (BID) | CUTANEOUS | 0 refills | Status: DC

## 2021-07-09 NOTE — Telephone Encounter (Signed)
Triamcinolone cream sent to pharmacy.

## 2021-07-26 DIAGNOSIS — L509 Urticaria, unspecified: Secondary | ICD-10-CM | POA: Diagnosis not present

## 2021-07-29 ENCOUNTER — Other Ambulatory Visit: Payer: Self-pay | Admitting: Registered Nurse

## 2021-07-29 DIAGNOSIS — I10 Essential (primary) hypertension: Secondary | ICD-10-CM

## 2021-09-27 DIAGNOSIS — Z01419 Encounter for gynecological examination (general) (routine) without abnormal findings: Secondary | ICD-10-CM | POA: Diagnosis not present

## 2021-09-27 DIAGNOSIS — Z6836 Body mass index (BMI) 36.0-36.9, adult: Secondary | ICD-10-CM | POA: Diagnosis not present

## 2021-10-02 ENCOUNTER — Telehealth: Payer: Self-pay | Admitting: Registered Nurse

## 2021-10-04 NOTE — Telephone Encounter (Signed)
Encounter started in error.

## 2021-10-18 DIAGNOSIS — N946 Dysmenorrhea, unspecified: Secondary | ICD-10-CM | POA: Diagnosis not present

## 2021-11-30 ENCOUNTER — Other Ambulatory Visit: Payer: Self-pay | Admitting: Registered Nurse

## 2021-11-30 DIAGNOSIS — I10 Essential (primary) hypertension: Secondary | ICD-10-CM

## 2021-12-20 DIAGNOSIS — Z1231 Encounter for screening mammogram for malignant neoplasm of breast: Secondary | ICD-10-CM | POA: Diagnosis not present

## 2021-12-24 ENCOUNTER — Other Ambulatory Visit: Payer: Self-pay | Admitting: Obstetrics and Gynecology

## 2021-12-24 DIAGNOSIS — R928 Other abnormal and inconclusive findings on diagnostic imaging of breast: Secondary | ICD-10-CM

## 2022-01-24 ENCOUNTER — Ambulatory Visit
Admission: RE | Admit: 2022-01-24 | Discharge: 2022-01-24 | Disposition: A | Discharge status: Home / Self Care | Payer: BC Managed Care – PPO | Source: Ambulatory Visit | Attending: Obstetrics and Gynecology | Admitting: Obstetrics and Gynecology

## 2022-01-24 ENCOUNTER — Other Ambulatory Visit: Payer: Self-pay | Admitting: Obstetrics and Gynecology

## 2022-01-24 DIAGNOSIS — R928 Other abnormal and inconclusive findings on diagnostic imaging of breast: Secondary | ICD-10-CM

## 2022-01-24 DIAGNOSIS — R922 Inconclusive mammogram: Secondary | ICD-10-CM | POA: Diagnosis not present

## 2022-02-01 ENCOUNTER — Ambulatory Visit
Admission: RE | Admit: 2022-02-01 | Discharge: 2022-02-01 | Disposition: A | Discharge status: Home / Self Care | Payer: BC Managed Care – PPO | Source: Ambulatory Visit | Attending: Obstetrics and Gynecology | Admitting: Obstetrics and Gynecology

## 2022-02-01 DIAGNOSIS — D241 Benign neoplasm of right breast: Secondary | ICD-10-CM | POA: Diagnosis not present

## 2022-02-01 DIAGNOSIS — R928 Other abnormal and inconclusive findings on diagnostic imaging of breast: Secondary | ICD-10-CM

## 2022-02-01 DIAGNOSIS — N6315 Unspecified lump in the right breast, overlapping quadrants: Secondary | ICD-10-CM | POA: Diagnosis not present

## 2022-02-06 ENCOUNTER — Other Ambulatory Visit: Payer: BC Managed Care – PPO

## 2022-04-02 ENCOUNTER — Other Ambulatory Visit: Payer: Self-pay | Admitting: Registered Nurse

## 2022-04-02 DIAGNOSIS — I10 Essential (primary) hypertension: Secondary | ICD-10-CM

## 2022-05-10 DIAGNOSIS — M5416 Radiculopathy, lumbar region: Secondary | ICD-10-CM | POA: Diagnosis not present

## 2022-05-10 DIAGNOSIS — M545 Low back pain, unspecified: Secondary | ICD-10-CM | POA: Diagnosis not present

## 2022-07-02 LAB — LIPID PANEL
Cholesterol: 227 — AB (ref 0–200)
HDL: 67 (ref 35–70)
LDL Cholesterol: 149
Triglycerides: 62 (ref 40–160)

## 2022-07-02 LAB — T3 UPTAKE: T3 Uptake: 19

## 2022-07-02 LAB — COMPREHENSIVE METABOLIC PANEL
Albumin: 4.6 (ref 3.5–5.0)
Calcium: 10.1 (ref 8.7–10.7)
Globulin: 3.4

## 2022-07-02 LAB — BASIC METABOLIC PANEL
BUN: 13 (ref 4–21)
CO2: 25 — AB (ref 13–22)
Chloride: 99 (ref 99–108)
Creatinine: 0.6 (ref 0.5–1.1)
Glucose: 87
Potassium: 3.9 mEq/L (ref 3.5–5.1)
Sodium: 138 (ref 137–147)

## 2022-07-02 LAB — HEPATIC FUNCTION PANEL
ALT: 12 U/L (ref 7–35)
AST: 15 (ref 13–35)
Alkaline Phosphatase: 85 (ref 25–125)
Bilirubin, Total: 0.2

## 2022-07-02 LAB — T4: T4, Total: 7.1

## 2022-07-02 LAB — CBC AND DIFFERENTIAL
HCT: 41 (ref 36–46)
Hemoglobin: 13.4 (ref 12.0–16.0)
Platelets: 344 10*3/uL (ref 150–400)
WBC: 4.6

## 2022-07-02 LAB — TSH: TSH: 0.97 (ref 0.41–5.90)

## 2022-07-02 LAB — CBC: RBC: 4.55 (ref 3.87–5.11)

## 2022-07-02 LAB — FREE THYROXINE INDEX: Free Thyroxine Index: 1.3

## 2022-07-02 LAB — HEMOGLOBIN A1C: Hemoglobin A1C: 6

## 2022-09-27 ENCOUNTER — Ambulatory Visit: Payer: BC Managed Care – PPO | Admitting: Nurse Practitioner

## 2022-09-27 ENCOUNTER — Encounter: Payer: Self-pay | Admitting: Nurse Practitioner

## 2022-09-27 VITALS — BP 130/76 | HR 83 | Temp 97.0°F | Resp 12 | Ht 64.0 in | Wt 228.4 lb

## 2022-09-27 DIAGNOSIS — Z2821 Immunization not carried out because of patient refusal: Secondary | ICD-10-CM

## 2022-09-27 DIAGNOSIS — Z0000 Encounter for general adult medical examination without abnormal findings: Secondary | ICD-10-CM | POA: Diagnosis not present

## 2022-09-27 DIAGNOSIS — K5900 Constipation, unspecified: Secondary | ICD-10-CM | POA: Diagnosis not present

## 2022-09-27 DIAGNOSIS — I10 Essential (primary) hypertension: Secondary | ICD-10-CM

## 2022-09-27 DIAGNOSIS — R7303 Prediabetes: Secondary | ICD-10-CM | POA: Diagnosis not present

## 2022-09-27 DIAGNOSIS — E7800 Pure hypercholesterolemia, unspecified: Secondary | ICD-10-CM

## 2022-09-27 DIAGNOSIS — Z6839 Body mass index (BMI) 39.0-39.9, adult: Secondary | ICD-10-CM

## 2022-09-27 MED ORDER — LINACLOTIDE 72 MCG PO CAPS: 72.0000 ug | ORAL_CAPSULE | Freq: Every day | ORAL | 5 refills | Status: DC

## 2022-09-27 MED ORDER — ENALAPRIL-HYDROCHLOROTHIAZIDE 5-12.5 MG PO TABS: 1.0000 | ORAL_TABLET | Freq: Every day | ORAL | 3 refills | Status: DC

## 2022-09-27 NOTE — Assessment & Plan Note (Signed)
Encouraged healthy lifestyle modifications in regards to diet and exercise.  Patient will do a 63-monthlab follow-up to check A1c and lipids

## 2022-09-27 NOTE — Assessment & Plan Note (Signed)
Discussed age-appropriate immunizations and screening exams.  Patient is up-to-date.  Did give patient print off at dismissal of office about health maintenance and preventative health care with some anticipatory guidance.

## 2022-09-27 NOTE — Assessment & Plan Note (Signed)
Longstanding issue.  Has been evaluated by gastroenterology, Dr. Fuller Plan.  Patient was on Linzess in the past became too expensive.  We will retrial Linzess to see if this is an affordable option.  Start Linzess 72 mcg daily

## 2022-09-27 NOTE — Progress Notes (Signed)
New Patient Office Visit  Subjective    Patient ID: Amy Lamb, female    DOB: Feb 11, 1976  Age: 46 y.o. MRN: [id number]  CC:  Chief Complaint  Patient presents with   Establish Care    Previous PCP Dr Orland Mustard, gyn Dr Radene Knee    HPI Amy Lamb presents to establish care   HTN: has been checking her blood pressure at home. States that she is unsure if her cuff is acurate. States it has been 100/76. States typically she will be getting readings around 865 systolically.  Constipation: States that she was seen by Dr. Fuller Plan. States that she was on linzess and it was too expensive.  She was maintained on Linzess 145 mcg daily.   Has tried metamucil, states that starts to help and then her bowels go back to normal with continued use. She has been using ducolax, this does help. She has been taking 2 capsules to get her bowels to move.  Prediabetes: Patient is working in regards to cutting down on blatant sugars and beverages throughout the day.   for complete physical and follow up of chronic conditions.  Immunizations: -Tetanus: Up-to-date -Influenza: Refused -Shingles: Too young -Pneumonia: Too young - covid: Sport and exercise psychologist  -HPV: Aged out  Diet: Fair diet. States that she has gained approx 10 pounds. States that 1-2 a day. Will skip breafast most days. Will have coffee sometimes. Weaning off sodas. Water and Sweet tea. Exercise: No regular exercise. Currently. She will start and then does not continue. States that she bought a bike. States that her left leg would cramp  Eye exam: prn Dental exam: Needs updating, last year   Pap Smear: Completed in patient has an appointment Monday with Dr. Radene Knee Mammogram: Completed in February 2023 that required a right breast biopsy and clip placement  Colonoscopy: Completed in 2021 with Dr. Lucio Edward.  Recall 10 years due in 2031 Lung Cancer Screening: N/A Dexa: N/A   Sleep: states she goes to bed aroun 930-10. Gets up at 5 2  days and the others days gets up at 6. States that she feels rested. Does snore some   Outpatient Encounter Medications as of 09/27/2022  Medication Sig   Acetaminophen (TYLENOL PO) Take by mouth.   cetirizine (ZYRTEC) 10 MG tablet Take 1 tablet (10 mg total) by mouth daily.   ibuprofen (ADVIL) 800 MG tablet Take 1 tablet (800 mg total) by mouth 3 (three) times daily.   linaclotide (LINZESS) 72 MCG capsule Take 1 capsule (72 mcg total) by mouth daily before breakfast.   meloxicam (MOBIC) 15 MG tablet Take 15 mg by mouth daily.   Multiple Vitamins-Minerals (HAIR SKIN AND NAILS FORMULA PO) Take by mouth.   [DISCONTINUED] Enalapril-hydroCHLOROthiazide 5-12.5 MG tablet TAKE 1 TABLET BY MOUTH DAILY   [DISCONTINUED] meloxicam (MOBIC) 7.5 MG tablet TK 1 T PO BID   Enalapril-hydroCHLOROthiazide 5-12.5 MG tablet Take 1 tablet by mouth daily.   [DISCONTINUED] cyanocobalamin 100 MCG tablet Take 100 mcg by mouth daily.   [DISCONTINUED] cyclobenzaprine (FLEXERIL) 5 MG tablet TK 1 T PO QD   [DISCONTINUED] linaclotide (LINZESS) 145 MCG CAPS capsule Take 1 capsule (145 mcg total) by mouth daily before breakfast.   [DISCONTINUED] predniSONE (STERAPRED UNI-PAK 21 TAB) 10 MG (21) TBPK tablet Take per package instructions. Do not skip doses. Finish entire supply.   [DISCONTINUED] triamcinolone cream (KENALOG) 0.1 % Apply 1 application topically 2 (two) times daily.   [DISCONTINUED] VITAMIN D, CHOLECALCIFEROL, PO Take by  mouth.   No facility-administered encounter medications on file as of 09/27/2022.    Past Medical History:  Diagnosis Date   Hypertension     Past Surgical History:  Procedure Laterality Date   BACK SURGERY     SPINAL FUSION  09/2018    Family History  Problem Relation Age of Onset   Diabetes Mother    Hyperlipidemia Mother    Hypertension Mother    Heart disease Maternal Grandmother    Hyperlipidemia Maternal Grandmother    Hypertension Maternal Grandmother    Colon cancer Neg  Hx    Esophageal cancer Neg Hx    Stomach cancer Neg Hx    Rectal cancer Neg Hx     Social History   Socioeconomic History   Marital status: Single    Spouse name: Not on file   Number of children: 0   Years of education: Some college   Highest education level: Not on file  Occupational History   Not on file  Tobacco Use   Smoking status: Never   Smokeless tobacco: Never  Vaping Use   Vaping Use: Never used  Substance and Sexual Activity   Alcohol use: Yes    Comment: occasionally. If she goes out once a month. 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    Process food orders      Hobbies; Enjoys watching tc, family, going to [employer], and cooking   Social Determinants of Health   Financial Resource Strain: Low Risk  (06/08/2019)   Overall Financial Resource Strain (CARDIA)    Difficulty of Paying Living Expenses: Not hard at all  Food Insecurity: No Food Insecurity (06/08/2019)   Hunger Vital Sign    Worried About Running Out of Food in the Last Year: Never true    [place] in the Last Year: Never true  Transportation Needs: No Transportation Needs (06/08/2019)   PRAPARE - Hydrologist (Medical): No    Lack of Transportation (Non-Medical): No  Physical Activity: Inactive (06/08/2019)   Exercise Vital Sign    Days of Exercise per Week: 0 days    Minutes of Exercise per Session: 0 min  Stress: Not on file  Social Connections: Moderately Isolated (06/08/2019)   Social Connection and Isolation Panel [NHANES]    Frequency of Communication with Friends and Family: More than three times a week    Frequency of Social Gatherings with Friends and Family: Three times a week    Attends Religious Services: More than 4 times per year    Active Member of Clubs or Organizations: No    Attends Archivist Meetings: Never    Marital  Status: Never married  Intimate Partner Violence: Not At Risk (06/08/2019)   Humiliation, Afraid, Rape, and Kick questionnaire    Fear of Current or Ex-Partner: No    Emotionally Abused: No    Physically Abused: No    Sexually Abused: No    Review of Systems  Constitutional:  Negative for chills and fever.  Respiratory:  Negative for shortness of breath.   Cardiovascular:  Positive for leg swelling. Negative for chest pain.  Gastrointestinal:  Positive for constipation. Negative for abdominal pain, blood in stool, diarrhea, nausea and vomiting.       BM 1-2 times a week   Genitourinary:  Negative for  dysuria and hematuria.  Neurological:  Negative for dizziness and headaches.  Psychiatric/Behavioral:  Negative for hallucinations and suicidal ideas.         Objective    BP 130/76   Pulse 83   Temp (!) 97 F (36.1 C) (Temporal)   Resp 12   Ht '5\' 4"'$  (1.626 m)   Wt 228 lb 6 oz (103.6 kg)   LMP 09/16/2022   SpO2 99%   BMI 39.20 kg/m   Physical Exam Vitals and nursing note reviewed.  Constitutional:      Appearance: Normal appearance.  HENT:     Right Ear: Ear canal and external ear normal. There is impacted cerumen.     Left Ear: Tympanic membrane, ear canal and external ear normal.     Mouth/Throat:     Mouth: Mucous membranes are moist.     Pharynx: Oropharynx is clear.  Eyes:     Extraocular Movements: Extraocular movements intact.     Pupils: Pupils are equal, round, and reactive to light.  Cardiovascular:     Rate and Rhythm: Normal rate and regular rhythm.     Pulses: Normal pulses.     Heart sounds: Normal heart sounds.  Pulmonary:     Effort: Pulmonary effort is normal.     Breath sounds: Normal breath sounds.  Abdominal:     General: Bowel sounds are normal. There is no distension.     Palpations: There is no mass.     Tenderness: There is no abdominal tenderness.     Hernia: No hernia is present.  Lymphadenopathy:     Cervical: No cervical  adenopathy.  Skin:    General: Skin is warm.  Neurological:     General: No focal deficit present.     Mental Status: She is alert.     Deep Tendon Reflexes:     Reflex Scores:      Bicep reflexes are 2+ on the right side and 2+ on the left side.      Patellar reflexes are 2+ on the right side and 2+ on the left side.    Comments: Bilateral upper and lower extremity strength 5/5  Psychiatric:        Mood and Affect: Mood normal.        Behavior: Behavior normal.        Thought Content: Thought content normal.        Judgment: Judgment normal.         Assessment & Plan:   Problem List Items Addressed This Visit       Cardiovascular and Mediastinum   Essential hypertension    Patient does check blood pressure at home.  Currently maintained on benazepril-hydrochlorothiazide.  Continue taking medication as prescribed continue taking blood pressure at home.  Parameters given when to seek advice from the office in regards to blood pressure      Relevant Medications   Enalapril-hydroCHLOROthiazide 5-12.5 MG tablet     Other   Prediabetes    Encouraged healthy lifestyle modifications in regards to diet and exercise.  Patient will do a 58-monthlab follow-up to check A1c and lipids      Relevant Orders   Lipid panel   Hemoglobin A1c   Constipation    Longstanding issue.  Has been evaluated by gastroenterology, Dr. SFuller Plan  Patient was on Linzess in the past became too expensive.  We will retrial Linzess to see if this is an affordable option.  Start Linzess 72 mcg daily  Relevant Medications   linaclotide (LINZESS) 72 MCG capsule   Preventative health care - Primary    Discussed age-appropriate immunizations and screening exams.  Patient is up-to-date.  Did give patient print off at dismissal of office about health maintenance and preventative health care with some anticipatory guidance.      Other Visit Diagnoses     Influenza vaccination declined       Elevated LDL  cholesterol level       Relevant Orders   Lipid panel   Hemoglobin A1c   Class 2 severe obesity due to excess calories with serious comorbidity and body mass index (BMI) of 39.0 to 39.9 in adult [facility])       Relevant Orders   Lipid panel   Hemoglobin A1c       Return in about 1 year (around 09/28/2023) for CPe and lab.   Romilda Garret, NP

## 2022-09-27 NOTE — Patient Instructions (Addendum)
Nice to see you today Work on your exercise. We want you to be at 30 mins a day 5 times a week Try eating some breakfast also Keep up the good work in reducing your soda intake Follow up with me in 1 year for your next phyiscal, sooner if you need me  If you get bp reading on the top of 140+ or 90+ on the bottom consistently come into the office and we may need to change the blood pressure medication   Make a lab appointment for 6 months for fasting lab to recheck the cholesterol and A1C.

## 2022-09-27 NOTE — Assessment & Plan Note (Signed)
Patient does check blood pressure at home.  Currently maintained on benazepril-hydrochlorothiazide.  Continue taking medication as prescribed continue taking blood pressure at home.  Parameters given when to seek advice from the office in regards to blood pressure

## 2022-09-30 DIAGNOSIS — Z1151 Encounter for screening for human papillomavirus (HPV): Secondary | ICD-10-CM | POA: Diagnosis not present

## 2022-09-30 DIAGNOSIS — B009 Herpesviral infection, unspecified: Secondary | ICD-10-CM | POA: Diagnosis not present

## 2022-09-30 DIAGNOSIS — Z113 Encounter for screening for infections with a predominantly sexual mode of transmission: Secondary | ICD-10-CM | POA: Diagnosis not present

## 2022-09-30 DIAGNOSIS — Z6838 Body mass index (BMI) 38.0-38.9, adult: Secondary | ICD-10-CM | POA: Diagnosis not present

## 2022-09-30 DIAGNOSIS — B001 Herpesviral vesicular dermatitis: Secondary | ICD-10-CM | POA: Diagnosis not present

## 2022-09-30 DIAGNOSIS — Z01419 Encounter for gynecological examination (general) (routine) without abnormal findings: Secondary | ICD-10-CM | POA: Diagnosis not present

## 2022-09-30 DIAGNOSIS — Z124 Encounter for screening for malignant neoplasm of cervix: Secondary | ICD-10-CM | POA: Diagnosis not present

## 2022-10-02 LAB — HM PAP SMEAR: HPV, high-risk: NEGATIVE

## 2022-10-09 DIAGNOSIS — A599 Trichomoniasis, unspecified: Secondary | ICD-10-CM | POA: Diagnosis not present

## 2022-10-09 DIAGNOSIS — A5901 Trichomonal vulvovaginitis: Secondary | ICD-10-CM | POA: Diagnosis not present

## 2023-01-27 ENCOUNTER — Encounter: Payer: Self-pay | Admitting: Obstetrics and Gynecology

## 2023-01-27 DIAGNOSIS — Z1231 Encounter for screening mammogram for malignant neoplasm of breast: Secondary | ICD-10-CM

## 2023-03-15 ENCOUNTER — Ambulatory Visit
Admission: RE | Admit: 2023-03-15 | Discharge: 2023-03-15 | Disposition: A | Discharge status: Home / Self Care | Payer: BC Managed Care – PPO | Source: Ambulatory Visit | Attending: Urgent Care | Admitting: Urgent Care

## 2023-03-15 VITALS — BP 145/74 | HR 93 | Temp 99.1°F | Resp 18

## 2023-03-15 DIAGNOSIS — H6123 Impacted cerumen, bilateral: Secondary | ICD-10-CM | POA: Insufficient documentation

## 2023-03-15 DIAGNOSIS — I10 Essential (primary) hypertension: Secondary | ICD-10-CM | POA: Diagnosis not present

## 2023-03-15 DIAGNOSIS — L509 Urticaria, unspecified: Secondary | ICD-10-CM | POA: Diagnosis not present

## 2023-03-15 DIAGNOSIS — J069 Acute upper respiratory infection, unspecified: Secondary | ICD-10-CM | POA: Diagnosis not present

## 2023-03-15 DIAGNOSIS — Z1152 Encounter for screening for COVID-19: Secondary | ICD-10-CM | POA: Insufficient documentation

## 2023-03-15 DIAGNOSIS — B349 Viral infection, unspecified: Secondary | ICD-10-CM | POA: Insufficient documentation

## 2023-03-15 MED ORDER — IPRATROPIUM BROMIDE 0.03 % NA SOLN
2.0000 | Freq: Two times a day (BID) | NASAL | 0 refills | Status: AC
Start: 2023-03-15 — End: ?

## 2023-03-15 MED ORDER — PROMETHAZINE-DM 6.25-15 MG/5ML PO SYRP: 5.0000 mL | ORAL_SOLUTION | Freq: Three times a day (TID) | ORAL | 0 refills | Status: DC | PRN

## 2023-03-15 MED ORDER — CETIRIZINE HCL 10 MG PO TABS: 10.0000 mg | ORAL_TABLET | Freq: Every day | ORAL | 0 refills | Status: DC

## 2023-03-15 MED ORDER — CARBAMIDE PEROXIDE 6.5 % OT SOLN: 5.0000 [drp] | Freq: Every day | OTIC | 0 refills | Status: DC | PRN

## 2023-03-15 NOTE — Discharge Instructions (Addendum)
We will notify you of your test results as they arrive and may take between about 24 hours.  I encourage you to sign up for MyChart if you have not already done so as this can be the easiest way for [place] to communicate results to you online or through a phone app.  Generally, we only contact you if it is a positive test result.  In the meantime, if you develop worsening symptoms including fever, chest pain, shortness of breath despite our current treatment plan then please report to the emergency room as this may be a sign of worsening status from possible viral infection.  Otherwise, we will manage this as a viral syndrome. For sore throat or cough try using a honey-based tea. Use 3 teaspoons of honey with juice squeezed from half lemon. Place shaved pieces of ginger into 1/2-1 cup of water and warm over stove top. Then mix the ingredients and repeat every 4 hours as needed. Please take Tylenol 500mg -650mg  every 6 hours for aches and pains, fevers. Hydrate very well with at least 2 liters of water. Eat light meals such as soups to replenish electrolytes and soft fruits, veggies. Start an antihistamine like Zyrtec (10mg  daily) for postnasal drainage, sinus congestion.  You can take this together with the nasal spray 2-3 times a day as needed for the same kind of congestion.  Use the cough medications as needed.

## 2023-03-15 NOTE — ED Triage Notes (Signed)
Pt reports cough, nasal congestion, left ear pain, bilateral ear fullness and scratchy throat x 4 days; loss of smell and taste x 1 day.

## 2023-03-15 NOTE — ED Provider Notes (Signed)
[facility]  Note:  This document was prepared using Systems analyst and may include unintentional dictation errors.  MRN: [id number] DOB: [birth year]  Subjective:   Amy Lamb is a 47 y.o. female presenting for 4-day history of acute onset persistent malaise, fatigue, coughing, sinus congestion, bilateral ear fullness, left ear pain.  Has difficulty with earwax buildup.  In the past day she has lost her sense of taste and smell.  Has not had COVID-19 to the best of her knowledge.  Has hypertension, forgot to take her blood pressure medication this morning.  No chest pain, shortness of breath or wheezing.  No asthma. No smoking of any kind including cigarettes, cigars, vaping, marijuana use.    No current facility-administered medications for this encounter.  Current Outpatient Medications:    Acetaminophen (TYLENOL PO), Take by mouth., Disp: , Rfl:    cetirizine (ZYRTEC) 10 MG tablet, Take 1 tablet (10 mg total) by mouth daily., Disp: 30 tablet, Rfl: 11   Enalapril-hydroCHLOROthiazide 5-12.5 MG tablet, Take 1 tablet by mouth daily., Disp: 90 tablet, Rfl: 3   ibuprofen (ADVIL) 800 MG tablet, Take 1 tablet (800 mg total) by mouth 3 (three) times daily., Disp: 21 tablet, Rfl: 0   linaclotide (LINZESS) 72 MCG capsule, Take 1 capsule (72 mcg total) by mouth daily before breakfast., Disp: 30 capsule, Rfl: 5   meloxicam (MOBIC) 15 MG tablet, Take 15 mg by mouth daily., Disp: , Rfl:    Multiple Vitamins-Minerals (HAIR SKIN AND NAILS FORMULA PO), Take by mouth., Disp: , Rfl:    No Known Allergies  Past Medical History:  Diagnosis Date   Hypertension      Past Surgical History:  Procedure Laterality Date   BACK SURGERY     SPINAL FUSION  09/2018    Family History  Problem Relation Age of Onset   Diabetes Mother    Hyperlipidemia Mother    Hypertension Mother    Heart disease Maternal Grandmother    Hyperlipidemia Maternal Grandmother     Hypertension Maternal Grandmother    Colon cancer Neg Hx    Esophageal cancer Neg Hx    Stomach cancer Neg Hx    Rectal cancer Neg Hx     Social History   Tobacco Use   Smoking status: Never   Smokeless tobacco: Never  Vaping Use   Vaping Use: Never used  Substance Use Topics   Alcohol use: Yes    Comment: occasionally. If she goes out once a month. Liquor   Drug use: No    ROS   Objective:   Vitals: BP (!) 145/74 (BP Location: Right Arm)   Pulse 93   Temp 99.1 F (37.3 C) (Oral)   Resp 18   LMP 02/28/2023 (Exact Date)   SpO2 95%   Physical Exam Constitutional:      General: She is not in acute distress.    Appearance: Normal appearance. She is well-developed and normal weight. She is not ill-appearing, toxic-appearing or diaphoretic.  HENT:     Head: Normocephalic and atraumatic.     Right Ear: Tympanic membrane, ear canal and external ear normal. No drainage or tenderness. No middle ear effusion. There is no impacted cerumen. Tympanic membrane is not erythematous or bulging.     Left Ear: Tympanic membrane, ear canal and external ear normal. No drainage or tenderness.  No middle ear effusion. There is no impacted cerumen. Tympanic membrane is not erythematous or bulging.  Ears:     Comments: Ceruminous ear canals but still patent.  TMs visualized.    Nose: Nose normal. No congestion or rhinorrhea.     Mouth/Throat:     Mouth: Mucous membranes are moist. No oral lesions.     Pharynx: No pharyngeal swelling, oropharyngeal exudate, posterior oropharyngeal erythema or uvula swelling.     Tonsils: No tonsillar exudate or tonsillar abscesses. 0 on the right. 0 on the left.  Eyes:     General: No scleral icterus.       Right eye: No discharge.        Left eye: No discharge.     Extraocular Movements: Extraocular movements intact.     Right eye: Normal extraocular motion.     Left eye: Normal extraocular motion.     Conjunctiva/sclera: Conjunctivae normal.   Cardiovascular:     Rate and Rhythm: Normal rate and regular rhythm.     Heart sounds: Normal heart sounds. No murmur heard.    No friction rub. No gallop.  Pulmonary:     Effort: Pulmonary effort is normal. No respiratory distress.     Breath sounds: No stridor. No wheezing, rhonchi or rales.  Chest:     Chest wall: No tenderness.  Musculoskeletal:     Cervical back: Normal range of motion and neck supple.  Lymphadenopathy:     Cervical: No cervical adenopathy.  Skin:    General: Skin is warm and dry.  Neurological:     General: No focal deficit present.     Mental Status: She is alert and oriented to person, place, and time.  Psychiatric:        Mood and Affect: Mood normal.        Behavior: Behavior normal.     Assessment and Plan :   PDMP not reviewed this encounter.  1. Acute viral syndrome   2. Essential hypertension   3. Urticaria   4. Excessive cerumen in both ear canals     Debrox for the ears.  No need for ear lavage in the clinic as they are not impacted. Deferred imaging given clear cardiopulmonary exam, hemodynamically stable vital signs. Will manage for viral illness such as viral URI, viral syndrome, viral rhinitis, COVID-19. Recommended supportive care. Offered scripts for symptomatic relief. Testing is pending. Counseled patient on potential for adverse effects with medications prescribed/recommended today, ER and return-to-clinic precautions discussed, patient verbalized understanding.   Patient should undergo Paxlovid treatment should she test positive for COVID-19.   Jaynee Eagles, PA-C 03/15/23 1000

## 2023-03-17 LAB — SARS CORONAVIRUS 2 (TAT 6-24 HRS): SARS Coronavirus 2: NEGATIVE

## 2023-03-20 ENCOUNTER — Encounter: Payer: Self-pay | Admitting: Nurse Practitioner

## 2023-03-20 ENCOUNTER — Telehealth (INDEPENDENT_AMBULATORY_CARE_PROVIDER_SITE_OTHER): Payer: BC Managed Care – PPO | Admitting: Nurse Practitioner

## 2023-03-20 VITALS — Ht 64.0 in | Wt 228.4 lb

## 2023-03-20 DIAGNOSIS — H938X3 Other specified disorders of ear, bilateral: Secondary | ICD-10-CM

## 2023-03-20 DIAGNOSIS — H9313 Tinnitus, bilateral: Secondary | ICD-10-CM | POA: Diagnosis not present

## 2023-03-20 MED ORDER — PREDNISONE 20 MG PO TABS: ORAL_TABLET | ORAL | 0 refills | Status: AC

## 2023-03-20 NOTE — Assessment & Plan Note (Signed)
Will do a course of prednisone to see if this rectifies the situation.  Patient has appoint with ENT but several weeks out.  She can follow-up with me if no improvement and she should keep her appointment with ENT

## 2023-03-20 NOTE — Patient Instructions (Addendum)
Nice to see you today I have sent in prednisone for you to try Continue using the flonase nasal spray Start taking the Zyrtec (cetrizine). Keep the ENT appointment as scheduled Follow up if no improvement

## 2023-03-20 NOTE — Progress Notes (Signed)
Ph: [phone]       Fax: [phone]   Patient ID: Amy Lamb, female    DOB: [birth year], 47 y.o.   MRN: [id number]  Virtual visit completed through [employer], a video enabled telemedicine application. Due to national recommendations of social distancing due to COVID-19, a virtual visit is felt to be most appropriate for this patient at this time. Reviewed limitations, risks, security and privacy concerns of performing a virtual visit and the availability of in person appointments. I also reviewed that there may be a patient responsible charge related to this service. The patient agreed to proceed.   Patient location: home Provider location: [place] at [facility], office Persons participating in this virtual visit: patient, provider   If any vitals were documented, they were collected by patient at home unless specified below.    Ht 5\' 4"  (1.626 m)   Wt 228 lb 6 oz (103.6 kg)   LMP 02/28/2023 (Exact Date)   BMI 39.20 kg/m    CC: Ear fullness Subjective:   HPI: Amy Lamb is a 47 y.o. female presenting on 03/20/2023 for Ear Fullness and Tinnitus (Went to UC last Saturday. )    Symptoms started on 9 days ago. States that she worsened Wednesday. She started taking otc medications. States she started Friday with loss of sense of smell and tastes.  She was evaluated at [facility] on 04/15/2023 and was dx with viral illness. She was tested for covid and it came back negative .    States she has used mucinex, sudafed, ipratropium bromide, states that the nasal spray prescribed was not helping so she switched to flonase     Relevant past medical, surgical, family and social history reviewed and updated as indicated. Interim medical history since our last visit reviewed. Allergies and medications reviewed and updated. Outpatient Medications Prior to Visit  Medication Sig Dispense Refill   Acetaminophen (TYLENOL PO) Take by mouth.     carbamide peroxide (DEBROX) 6.5 % OTIC  solution Place 5 drops into both ears daily as needed. 15 mL 0   cetirizine (ZYRTEC) 10 MG tablet Take 1 tablet (10 mg total) by mouth daily. 30 tablet 0   Enalapril-hydroCHLOROthiazide 5-12.5 MG tablet Take 1 tablet by mouth daily. 90 tablet 3   ibuprofen (ADVIL) 800 MG tablet Take 1 tablet (800 mg total) by mouth 3 (three) times daily. 21 tablet 0   ipratropium (ATROVENT) 0.03 % nasal spray Place 2 sprays into both nostrils 2 (two) times daily. 30 mL 0   linaclotide (LINZESS) 72 MCG capsule Take 1 capsule (72 mcg total) by mouth daily before breakfast. 30 capsule 5   meloxicam (MOBIC) 15 MG tablet Take 15 mg by mouth daily.     Multiple Vitamins-Minerals (HAIR SKIN AND NAILS FORMULA PO) Take by mouth.     promethazine-dextromethorphan (PROMETHAZINE-DM) 6.25-15 MG/5ML syrup Take 5 mLs by mouth 3 (three) times daily as needed for cough. 200 mL 0   No facility-administered medications prior to visit.     Per HPI unless specifically indicated in ROS section below Review of Systems  Constitutional:  Negative for chills, fatigue and fever.  HENT:  Positive for ear discharge. Negative for ear pain and sore throat.   Respiratory:  Positive for cough. Negative for shortness of breath.   Gastrointestinal:  Negative for diarrhea and nausea.  Neurological:  Negative for headaches.   Objective:  Ht 5\' 4"  (1.626 m)   Wt 228 lb 6 oz (103.6 kg)  LMP 02/28/2023 (Exact Date)   BMI 39.20 kg/m   Wt Readings from Last 3 Encounters:  03/20/23 228 lb 6 oz (103.6 kg)  09/27/22 228 lb 6 oz (103.6 kg)  05/24/21 214 lb 9.6 oz (97.3 kg)       Physical exam: Gen: alert, NAD, not ill appearing Pulm: speaks in complete sentences without increased work of breathing Psych: normal mood, normal thought content      Results for orders placed or performed during the hospital encounter of 03/15/23  SARS CORONAVIRUS 2 (TAT 6-24 HRS) Anterior Nasal Swab   Specimen: Anterior Nasal Swab  Result Value Ref Range    SARS Coronavirus 2 NEGATIVE NEGATIVE   Assessment & Plan:   Tinnitus of both ears Assessment & Plan: Will do a course of prednisone to see if this rectifies the situation.  Patient has appoint with ENT but several weeks out.  She can follow-up with me if no improvement and she should keep her appointment with ENT   Sensation of fullness in both ears Assessment & Plan: Patient was seen in urgent care and was noted that she had cerumen but no impaction.  Patient has been using Sudafed and Flonase.  Encouraged her to use antihistamine along with Flonase and I will send in a course of prednisone.  She has been established with ENT in the past and call to make an appointment with a cannot see her for several weeks.  Orders: -     predniSONE; Take 1 tablet (20 mg total) by mouth 2 (two) times daily with a meal for 3 days, THEN 1 tablet (20 mg total) daily with breakfast for 3 days. Avoid NSAIDs like: ibuprofen, aleve, motrin, naproxen, BC/Goody powders.  Dispense: 9 tablet; Refill: 0     I discussed the assessment and treatment plan with the patient. The patient was provided an opportunity to ask questions and all were answered. The patient agreed with the plan and demonstrated an understanding of the instructions. The patient was advised to call back or seek an in-person evaluation if the symptoms worsen or if the condition fails to improve as anticipated.  Follow up plan: Return if symptoms worsen or fail to improve.  Romilda Garret, NP

## 2023-03-20 NOTE — Assessment & Plan Note (Signed)
Patient was seen in urgent care and was noted that she had cerumen but no impaction.  Patient has been using Sudafed and Flonase.  Encouraged her to use antihistamine along with Flonase and I will send in a course of prednisone.  She has been established with ENT in the past and call to make an appointment with a cannot see her for several weeks.

## 2023-04-03 ENCOUNTER — Other Ambulatory Visit: Payer: BC Managed Care – PPO

## 2023-04-03 DIAGNOSIS — H6593 Unspecified nonsuppurative otitis media, bilateral: Secondary | ICD-10-CM | POA: Diagnosis not present

## 2023-04-03 DIAGNOSIS — H6123 Impacted cerumen, bilateral: Secondary | ICD-10-CM | POA: Diagnosis not present

## 2023-04-03 DIAGNOSIS — J069 Acute upper respiratory infection, unspecified: Secondary | ICD-10-CM | POA: Diagnosis not present

## 2023-04-03 DIAGNOSIS — Z9622 Myringotomy tube(s) status: Secondary | ICD-10-CM | POA: Diagnosis not present

## 2023-04-09 DIAGNOSIS — J069 Acute upper respiratory infection, unspecified: Secondary | ICD-10-CM | POA: Diagnosis not present

## 2023-04-09 DIAGNOSIS — H748X3 Other specified disorders of middle ear and mastoid, bilateral: Secondary | ICD-10-CM | POA: Diagnosis not present

## 2023-04-09 DIAGNOSIS — H6593 Unspecified nonsuppurative otitis media, bilateral: Secondary | ICD-10-CM | POA: Diagnosis not present

## 2023-04-09 DIAGNOSIS — Z01118 Encounter for examination of ears and hearing with other abnormal findings: Secondary | ICD-10-CM | POA: Diagnosis not present

## 2023-04-09 DIAGNOSIS — H900 Conductive hearing loss, bilateral: Secondary | ICD-10-CM | POA: Diagnosis not present

## 2023-04-09 DIAGNOSIS — Z9622 Myringotomy tube(s) status: Secondary | ICD-10-CM | POA: Diagnosis not present

## 2023-04-09 DIAGNOSIS — H73893 Other specified disorders of tympanic membrane, bilateral: Secondary | ICD-10-CM | POA: Diagnosis not present

## 2023-04-09 DIAGNOSIS — H6993 Unspecified Eustachian tube disorder, bilateral: Secondary | ICD-10-CM | POA: Diagnosis not present

## 2023-04-16 DIAGNOSIS — J069 Acute upper respiratory infection, unspecified: Secondary | ICD-10-CM | POA: Diagnosis not present

## 2023-04-16 DIAGNOSIS — H6993 Unspecified Eustachian tube disorder, bilateral: Secondary | ICD-10-CM | POA: Diagnosis not present

## 2023-04-16 DIAGNOSIS — H6593 Unspecified nonsuppurative otitis media, bilateral: Secondary | ICD-10-CM | POA: Diagnosis not present

## 2023-04-16 DIAGNOSIS — H900 Conductive hearing loss, bilateral: Secondary | ICD-10-CM | POA: Diagnosis not present

## 2023-08-08 ENCOUNTER — Ambulatory Visit: Admission: RE | Admit: 2023-08-08 | Payer: BC Managed Care – PPO | Source: Ambulatory Visit

## 2023-08-08 ENCOUNTER — Ambulatory Visit: Payer: BC Managed Care – PPO | Admitting: Nurse Practitioner

## 2023-08-08 ENCOUNTER — Encounter: Payer: Self-pay | Admitting: Nurse Practitioner

## 2023-08-08 VITALS — BP 124/80 | HR 74 | Temp 97.6°F | Ht 64.0 in | Wt 230.8 lb

## 2023-08-08 DIAGNOSIS — Z8719 Personal history of other diseases of the digestive system: Secondary | ICD-10-CM | POA: Diagnosis not present

## 2023-08-08 DIAGNOSIS — Z981 Arthrodesis status: Secondary | ICD-10-CM | POA: Diagnosis not present

## 2023-08-08 DIAGNOSIS — R1030 Lower abdominal pain, unspecified: Secondary | ICD-10-CM | POA: Insufficient documentation

## 2023-08-08 DIAGNOSIS — K5900 Constipation, unspecified: Secondary | ICD-10-CM

## 2023-08-08 DIAGNOSIS — R1031 Right lower quadrant pain: Secondary | ICD-10-CM | POA: Insufficient documentation

## 2023-08-08 LAB — POC URINALSYSI DIPSTICK (AUTOMATED)
Bilirubin, UA: NEGATIVE
Blood, UA: NEGATIVE
Glucose, UA: NEGATIVE
Ketones, UA: NEGATIVE
Leukocytes, UA: NEGATIVE
Nitrite, UA: NEGATIVE
Protein, UA: NEGATIVE
Spec Grav, UA: 1.015 (ref 1.010–1.025)
Urobilinogen, UA: 0.2 E.U./dL
pH, UA: 6 (ref 5.0–8.0)

## 2023-08-08 MED ORDER — LINACLOTIDE 72 MCG PO CAPS: 72.0000 ug | ORAL_CAPSULE | Freq: Every day | ORAL | 5 refills | Status: DC

## 2023-08-08 NOTE — Assessment & Plan Note (Signed)
UA in office is negative.  Patient recently had a period.  Does have a history of constipation does get better after bowel movement pending abdominal wound view x-ray.  Patient is not been able to use Linzess as of late due to cost.  Refill provided

## 2023-08-08 NOTE — Assessment & Plan Note (Signed)
History of same.  Has used Linzess in the past but it is too expensive per patient report.  Refill provided today pending abdominal 1 view

## 2023-08-08 NOTE — Progress Notes (Signed)
Acute Office Visit  Subjective:     Patient ID: Amy Lamb, female    DOB: 04/30/1976, 47 y.o.   MRN: [id number]  Chief Complaint  Patient presents with   Urinary Tract Infection    Pt complains of lower side pain L&R. Pt complains of feeling a little pressure when urinating. No burning pain. No frequent urination.       Patient is in today for urinary complaints with  history of HTN, prediabetes, and constipation.  States that it has been about a month. States that she is unsure if it is related to her back pain. States that sometimes she feels some pressure after urinating States that it is an achy pain and worse when she lays down. States that she will feel better after she has a movement.  Review of Systems  Constitutional:  Negative for chills and fever.  Gastrointestinal:  Negative for abdominal pain, nausea and vomiting.       States that she is doing linzess and did poop this am   Genitourinary:  Negative for dysuria, frequency and hematuria.       Itching, discharge, abnormal bleeding.  Musculoskeletal:  Positive for back pain (left to right).        Objective:    BP 124/80   Pulse 74   Temp 97.6 F (36.4 C) (Temporal)   Ht 5\' 4"  (1.626 m)   Wt 230 lb 12.8 oz (104.7 kg)   LMP 08/01/2023 (Approximate)   SpO2 98%   BMI 39.62 kg/m  BP Readings from Last 3 Encounters:  08/08/23 124/80  03/15/23 (!) 145/74  09/27/22 130/76   Wt Readings from Last 3 Encounters:  08/08/23 230 lb 12.8 oz (104.7 kg)  03/20/23 228 lb 6 oz (103.6 kg)  09/27/22 228 lb 6 oz (103.6 kg)   SpO2 Readings from Last 3 Encounters:  08/08/23 98%  03/15/23 95%  09/27/22 99%      Physical Exam Vitals and nursing note reviewed.  Constitutional:      Appearance: Normal appearance.  Cardiovascular:     Rate and Rhythm: Normal rate and regular rhythm.     Heart sounds: Normal heart sounds.  Pulmonary:     Effort: Pulmonary effort is normal.     Breath sounds: Normal breath  sounds.  Abdominal:     General: Bowel sounds are normal. There is no distension.     Palpations: There is no mass.     Tenderness: There is no abdominal tenderness. There is no right CVA tenderness or left CVA tenderness.     Hernia: No hernia is present.  Neurological:     Mental Status: She is alert.     Results for orders placed or performed in visit on 08/08/23  POCT Urinalysis Dipstick (Automated)  Result Value Ref Range   Color, UA yellow    Clarity, UA clear    Glucose, UA Negative Negative   Bilirubin, UA neg    Ketones, UA neg    Spec Grav, UA 1.015 1.010 - 1.025   Blood, UA neg    pH, UA 6.0 5.0 - 8.0   Protein, UA Negative Negative   Urobilinogen, UA 0.2 0.2 or 1.0 E.U./dL   Nitrite, UA neg    Leukocytes, UA Negative Negative        Assessment & Plan:   Problem List Items Addressed This Visit       Other   Constipation    History of same.  Has  used Linzess in the past but it is too expensive per patient report.  Refill provided today pending abdominal 1 view      Relevant Medications   linaclotide (LINZESS) 72 MCG capsule   Lower abdominal pain - Primary    UA in office is negative.  Patient recently had a period.  Does have a history of constipation does get better after bowel movement pending abdominal wound view x-ray.  Patient is not been able to use Linzess as of late due to cost.  Refill provided      Relevant Orders   POCT Urinalysis Dipstick (Automated) (Completed)   DG Abd 1 View    Meds ordered this encounter  Medications   linaclotide (LINZESS) 72 MCG capsule    Sig: Take 1 capsule (72 mcg total) by mouth daily before breakfast.    Dispense:  30 capsule    Refill:  5    Order Specific Question:   Supervising Provider    Answer:   Roxy Manns A [[id number]]    Return in about 8 weeks (around 10/03/2023) for CPE and Labs.  Audria Nine, NP

## 2023-08-08 NOTE — Patient Instructions (Signed)
Nice to see you today I will be in touch with the xray once I have reviewed it Follow up with me in 2 months for your physical and labs

## 2023-08-15 ENCOUNTER — Ambulatory Visit: Payer: BC Managed Care – PPO | Admitting: Nurse Practitioner

## 2023-10-18 ENCOUNTER — Other Ambulatory Visit: Payer: Self-pay | Admitting: Nurse Practitioner

## 2023-10-18 DIAGNOSIS — I10 Essential (primary) hypertension: Secondary | ICD-10-CM

## 2023-10-20 NOTE — Telephone Encounter (Signed)
Can we get patient scheduled for a CPE and labs within the next 30 days. Any open 20 min slot is ok

## 2023-10-20 NOTE — Telephone Encounter (Signed)
Spoke to pt, scheduled cpe for 11/07/23

## 2023-10-23 DIAGNOSIS — Z6838 Body mass index (BMI) 38.0-38.9, adult: Secondary | ICD-10-CM | POA: Diagnosis not present

## 2023-10-23 DIAGNOSIS — Z1231 Encounter for screening mammogram for malignant neoplasm of breast: Secondary | ICD-10-CM | POA: Diagnosis not present

## 2023-10-23 DIAGNOSIS — Z01419 Encounter for gynecological examination (general) (routine) without abnormal findings: Secondary | ICD-10-CM | POA: Diagnosis not present

## 2023-10-23 LAB — HM MAMMOGRAPHY

## 2023-11-07 ENCOUNTER — Encounter: Payer: Self-pay | Admitting: Nurse Practitioner

## 2023-11-07 ENCOUNTER — Ambulatory Visit (INDEPENDENT_AMBULATORY_CARE_PROVIDER_SITE_OTHER): Payer: BC Managed Care – PPO | Admitting: Nurse Practitioner

## 2023-11-07 VITALS — BP 124/82 | HR 85 | Temp 98.0°F | Ht 65.0 in | Wt 227.0 lb

## 2023-11-07 DIAGNOSIS — Z0001 Encounter for general adult medical examination with abnormal findings: Secondary | ICD-10-CM | POA: Diagnosis not present

## 2023-11-07 DIAGNOSIS — I10 Essential (primary) hypertension: Secondary | ICD-10-CM | POA: Diagnosis not present

## 2023-11-07 DIAGNOSIS — Z0000 Encounter for general adult medical examination without abnormal findings: Secondary | ICD-10-CM

## 2023-11-07 DIAGNOSIS — H6123 Impacted cerumen, bilateral: Secondary | ICD-10-CM | POA: Diagnosis not present

## 2023-11-07 DIAGNOSIS — K5900 Constipation, unspecified: Secondary | ICD-10-CM | POA: Diagnosis not present

## 2023-11-07 DIAGNOSIS — R7303 Prediabetes: Secondary | ICD-10-CM

## 2023-11-07 LAB — COMPREHENSIVE METABOLIC PANEL
ALT: 10 U/L (ref 0–35)
AST: 16 U/L (ref 0–37)
Albumin: 4.5 g/dL (ref 3.5–5.2)
Alkaline Phosphatase: 71 U/L (ref 39–117)
BUN: 9 mg/dL (ref 6–23)
CO2: 31 meq/L (ref 19–32)
Calcium: 10.1 mg/dL (ref 8.4–10.5)
Chloride: 99 meq/L (ref 96–112)
Creatinine, Ser: 0.66 mg/dL (ref 0.40–1.20)
GFR: 103.23 mL/min (ref >60.00)
Glucose, Bld: 95 mg/dL (ref 70–99)
Potassium: 4.1 meq/L (ref 3.5–5.1)
Sodium: 137 meq/L (ref 135–145)
Total Bilirubin: 0.6 mg/dL (ref 0.2–1.2)
Total Protein: 7.9 g/dL (ref 6.0–8.3)

## 2023-11-07 LAB — HEMOGLOBIN A1C: Hgb A1c MFr Bld: 6 % (ref 4.6–6.5)

## 2023-11-07 LAB — LIPID PANEL
Cholesterol: 248 mg/dL — ABNORMAL HIGH (ref 0–200)
HDL: 65.9 mg/dL (ref >39.00)
LDL Cholesterol: 168 mg/dL — ABNORMAL HIGH (ref 0–99)
NonHDL: 181.71
Total CHOL/HDL Ratio: 4
Triglycerides: 70 mg/dL (ref 0.0–149.0)
VLDL: 14 mg/dL (ref 0.0–40.0)

## 2023-11-07 LAB — CBC
HCT: 39.1 % (ref 36.0–46.0)
Hemoglobin: 13 g/dL (ref 12.0–15.0)
MCHC: 33.3 g/dL (ref 30.0–36.0)
MCV: 89.2 fL (ref 78.0–100.0)
Platelets: 382 10*3/uL (ref 150.0–400.0)
RBC: 4.38 Mil/uL (ref 3.87–5.11)
RDW: 14.3 % (ref 11.5–15.5)
WBC: 5.8 10*3/uL (ref 4.0–10.5)

## 2023-11-07 LAB — TSH: TSH: 1.15 u[IU]/mL (ref 0.35–5.50)

## 2023-11-07 MED ORDER — LINACLOTIDE 72 MCG PO CAPS: 72.0000 ug | ORAL_CAPSULE | Freq: Every day | ORAL | 5 refills | Status: AC

## 2023-11-07 NOTE — Patient Instructions (Addendum)
Nice to see you today I will be in touch with the labs once I have reviewed them Follow up with me in 1 year, sooner if you need me  Consider getting your flu vaccine

## 2023-11-07 NOTE — Assessment & Plan Note (Signed)
Discussed age-appropriate immunizations and screening exams.  Interview patient's personal, surgical, social, family histories.  Patient is up-to-date on all age-appropriate vaccinations she would like.  Patient declined flu vaccine today.  Patient up-to-date on CRC screening, breast cancer screening, cervical cancer screening.  Patient was given information at discharge about preventative healthcare maintenance with anticipatory guidance.

## 2023-11-07 NOTE — Assessment & Plan Note (Signed)
Verbal consent obtained.  Patient was prepped per office policy with cerumen softening eardrops.  A mixture of water and hydroperoxide was used.  Both the ears were irrigated.  Patient tolerated procedure well.  Both impactions were removed and TMs within normal limits postprocedure.

## 2023-11-07 NOTE — Progress Notes (Signed)
Established Patient Office Visit  Subjective   Patient ID: CHYNIA BRAZELTON, female    DOB: October 22, 1976  Age: 47 y.o. MRN: [id number]  Chief Complaint  Patient presents with   Annual Exam    Pap and mammo have been done 2 weeks ago. Pt will need both ear wash    HPI  for complete physical and follow up of chronic conditions.  HTN: states that she is not checking bp at home. Does   Constipation: on liness. States that she is taking it one to two times a week with BM that offtent   IDA Immunizations: -Tetanus: Completed in unsure -Influenza:  refused today will get later date -Shingles: Too young  -Pneumonia: Too young  Diet: Fair diet. She is eating 1 meals a day with some snacks on ocass. Tea, water, and soda  Exercise: No regular exercise. States that she started but has not kept up with it   Eye exam: PRN Dental exam: Needs updating     Colonoscopy: Completed in 2021 and recall in 10 years, due 2031 Lung Cancer Screening: N/A  Pap Smear: Richardean Chimera Pap smear 2 weeks ago.  Mammogram: see above with GYN done at GYN office.  Dexa: Too young   Sleep: goes to bed around 10 and get up 5-6 depending on work schedule. Feels rested sometimes.  States intermittent snoring       Review of Systems  Constitutional:  Negative for chills and fever.  Respiratory:  Negative for shortness of breath.   Cardiovascular:  Negative for chest pain and leg swelling.  Gastrointestinal:  Positive for constipation. Negative for abdominal pain, blood in stool, diarrhea, nausea and vomiting.       BM twice a week   Genitourinary:  Negative for dysuria and hematuria.  Neurological:  Negative for tingling and headaches.  Psychiatric/Behavioral:  Negative for hallucinations and suicidal ideas.       Objective:     BP 124/82   Pulse 85   Temp 98 F (36.7 C) (Oral)   Ht 5\' 5"  (1.651 m)   Wt 227 lb (103 kg)   LMP 10/17/2023 (Exact Date)   SpO2 94%   BMI 37.77 kg/m  BP  Readings from Last 3 Encounters:  11/07/23 124/82  08/08/23 124/80  03/15/23 (!) 145/74   Wt Readings from Last 3 Encounters:  11/07/23 227 lb (103 kg)  08/08/23 230 lb 12.8 oz (104.7 kg)  03/20/23 228 lb 6 oz (103.6 kg)      Physical Exam Vitals and nursing note reviewed.  Constitutional:      Appearance: Normal appearance.  HENT:     Right Ear: Ear canal and external ear normal. There is impacted cerumen.     Left Ear: Ear canal and external ear normal. There is impacted cerumen.     Mouth/Throat:     Mouth: Mucous membranes are moist.     Pharynx: Oropharynx is clear.  Eyes:     Extraocular Movements: Extraocular movements intact.     Pupils: Pupils are equal, round, and reactive to light.  Cardiovascular:     Rate and Rhythm: Normal rate and regular rhythm.     Pulses: Normal pulses.     Heart sounds: Normal heart sounds.  Pulmonary:     Effort: Pulmonary effort is normal.     Breath sounds: Normal breath sounds.  Abdominal:     General: Bowel sounds are normal. There is no distension.     Palpations: There  is no mass.     Tenderness: There is no abdominal tenderness.     Hernia: No hernia is present.  Musculoskeletal:     Right lower leg: No edema.     Left lower leg: No edema.  Lymphadenopathy:     Cervical: No cervical adenopathy.  Skin:    General: Skin is warm.  Neurological:     General: No focal deficit present.     Mental Status: She is alert.     Deep Tendon Reflexes:     Reflex Scores:      Bicep reflexes are 2+ on the right side and 2+ on the left side.      Patellar reflexes are 2+ on the right side and 2+ on the left side.    Comments: Bilateral upper and lower extremity strength 5/5  Psychiatric:        Mood and Affect: Mood normal.        Behavior: Behavior normal.        Thought Content: Thought content normal.        Judgment: Judgment normal.      No results found for any visits on 11/07/23.    The 10-year ASCVD risk score (Arnett  DK, et al., 2019) is: 2.3%* (Cholesterol units were assumed)    Assessment & Plan:   Problem List Items Addressed This Visit       Cardiovascular and Mediastinum   Essential hypertension    Patient currently maintained on benazepril-hydrochlorothiazide.  Blood pressure well-controlled.  Patient tolerating medication well.  Continue      Relevant Orders   CBC   Comprehensive metabolic panel   Hemoglobin A1c   Lipid panel     Nervous and Auditory   Bilateral impacted cerumen    Verbal consent obtained.  Patient was prepped per office policy with cerumen softening eardrops.  A mixture of water and hydroperoxide was used.  Both the ears were irrigated.  Patient tolerated procedure well.  Both impactions were removed and TMs within normal limits postprocedure.      Relevant Orders   Ear Lavage     Other   Prediabetes    History of the same.  Pending A1c today      Relevant Orders   Hemoglobin A1c   Lipid panel   Constipation    History of the same did review most recent colonoscopy Linzess 72 mcg daily refill provided today      Relevant Medications   linaclotide (LINZESS) 72 MCG capsule   Preventative health care - Primary    Discussed age-appropriate immunizations and screening exams.  Interview patient's personal, surgical, social, family histories.  Patient is up-to-date on all age-appropriate vaccinations she would like.  Patient declined flu vaccine today.  Patient up-to-date on CRC screening, breast cancer screening, cervical cancer screening.  Patient was given information at discharge about preventative healthcare maintenance with anticipatory guidance.      Relevant Orders   CBC   Comprehensive metabolic panel   TSH   Morbid obesity (HCC)    Pending TSH, A1c, lipid panel.  Patient encouraged to do 30 minutes of exercise 5 times a week per medical recommendation.  Patient to start slower with 10 minutes 1-2 times a week and work her way up      Relevant Orders    Hemoglobin A1c   Lipid panel    Return in about 1 year (around 11/06/2024) for CPE and Labs.    Audria Nine, NP

## 2023-11-07 NOTE — Assessment & Plan Note (Signed)
History of the same.  Pending A1c today

## 2023-11-07 NOTE — Assessment & Plan Note (Signed)
History of the same did review most recent colonoscopy Linzess 72 mcg daily refill provided today

## 2023-11-07 NOTE — Assessment & Plan Note (Signed)
Pending TSH, A1c, lipid panel.  Patient encouraged to do 30 minutes of exercise 5 times a week per medical recommendation.  Patient to start slower with 10 minutes 1-2 times a week and work her way up

## 2023-11-07 NOTE — Assessment & Plan Note (Signed)
Patient currently maintained on benazepril-hydrochlorothiazide.  Blood pressure well-controlled.  Patient tolerating medication well.  Continue

## 2023-11-15 ENCOUNTER — Other Ambulatory Visit: Payer: Self-pay | Admitting: Nurse Practitioner

## 2023-11-15 DIAGNOSIS — I10 Essential (primary) hypertension: Secondary | ICD-10-CM

## 2024-11-23 ENCOUNTER — Ambulatory Visit: Admitting: Family Medicine

## 2024-11-23 ENCOUNTER — Encounter: Payer: Self-pay | Admitting: Family Medicine

## 2024-11-23 VITALS — BP 100/60 | HR 96 | Temp 99.8°F | Ht 64.5 in | Wt 233.2 lb

## 2024-11-23 DIAGNOSIS — R1031 Right lower quadrant pain: Secondary | ICD-10-CM | POA: Diagnosis not present

## 2024-11-23 DIAGNOSIS — R350 Frequency of micturition: Secondary | ICD-10-CM | POA: Diagnosis not present

## 2024-11-23 LAB — POC URINALSYSI DIPSTICK (AUTOMATED)
Bilirubin, UA: NEGATIVE
Blood, UA: NEGATIVE
Glucose, UA: NEGATIVE
Ketones, UA: NEGATIVE
Leukocytes, UA: NEGATIVE
Nitrite, UA: NEGATIVE
Protein, UA: NEGATIVE
Spec Grav, UA: 1.025 (ref 1.010–1.025)
Urobilinogen, UA: 0.2 U/dL
pH, UA: 6 (ref 5.0–8.0)

## 2024-11-23 NOTE — Progress Notes (Signed)
 Patient ID: Amy Lamb, female    DOB: 1976-06-22, 48 y.o.   MRN: [id number]  This visit was conducted in person.  BP 100/60   Pulse 96   Temp 99.8 F (37.7 C) (Oral)   Ht 5' 4.5 (1.638 m)   Wt 233 lb 4 oz (105.8 kg)   LMP 11/14/2024   SpO2 99%   BMI 39.42 kg/m    CC:  Chief Complaint  Patient presents with   Urinary Frequency   Bladder Pressure   Back Pain    (Pressure)    Subjective:   HPI: Amy Lamb is a 48 y.o. female presenting on 11/23/2024 for Urinary Frequency, Bladder Pressure, and Back Pain ((Pressure))  PCP : Adina Crandall  New onset urinary frequency and pain in bladder, right lower abdomen.. constant. Noted in last  days.SABRA started after holding her urination last week.  Pain in low back  (  Thought at first it was gas.. took some gas pills.  Has also been more tired lately.   She is using motrin  tylenol mix yesterday and last night. Helps some.  No change in diet.   No vaginal  discharge, no itchy. Has uterus and ovaries.  No new partners. Menses  irregular 2 times this month.    BMs are not daily.. she has been using magnesium.. this week BM less frequent.SABRAlast BM 2-3 days ago. No fever.  No N/V.      Relevant past medical, surgical, family and social history reviewed and updated as indicated. Interim medical history since our last visit reviewed. Allergies and medications reviewed and updated. Outpatient Medications Prior to Visit  Medication Sig Dispense Refill   Enalapril -hydroCHLOROthiazide  5-12.5 MG tablet TAKE 1 TABLET BY MOUTH DAILY 90 tablet 3   Ferrous Sulfate (IRON PO) Take 1 tablet by mouth daily.     ibuprofen  (ADVIL ) 800 MG tablet Take 1 tablet (800 mg total) by mouth 3 (three) times daily. 21 tablet 0   ipratropium (ATROVENT ) 0.03 % nasal spray Place 2 sprays into both nostrils 2 (two) times daily. 30 mL 0   linaclotide  (LINZESS ) 72 MCG capsule Take 1 capsule (72 mcg total) by mouth daily before breakfast. 30  capsule 5   Multiple Vitamins-Minerals (HAIR SKIN AND NAILS FORMULA PO) Take by mouth.     Acetaminophen (TYLENOL PO) Take by mouth. (Patient not taking: Reported on 08/08/2023)     carbamide peroxide (DEBROX) 6.5 % OTIC solution Place 5 drops into both ears daily as needed. (Patient not taking: Reported on 08/08/2023) 15 mL 0   cetirizine  (ZYRTEC ) 10 MG tablet Take 1 tablet (10 mg total) by mouth daily. (Patient not taking: Reported on 11/07/2023) 30 tablet 0   meloxicam (MOBIC) 15 MG tablet Take 15 mg by mouth daily. (Patient not taking: Reported on 08/08/2023)     ofloxacin (FLOXIN) 0.3 % OTIC solution SMARTSIG:In Ear(s) (Patient not taking: Reported on 08/08/2023)     No facility-administered medications prior to visit.     Per HPI unless specifically indicated in ROS section below Review of Systems  Constitutional:  Negative for fatigue and fever.  HENT:  Negative for ear pain.   Eyes:  Negative for pain.  Respiratory:  Negative for chest tightness and shortness of breath.   Cardiovascular:  Negative for chest pain, palpitations and leg swelling.  Gastrointestinal:  Negative for abdominal pain.  Genitourinary:  Negative for dysuria.   Objective:  BP 100/60   Pulse 96   Temp  99.8 F (37.7 C) (Oral)   Ht 5' 4.5 (1.638 m)   Wt 233 lb 4 oz (105.8 kg)   LMP 11/14/2024   SpO2 99%   BMI 39.42 kg/m   Wt Readings from Last 3 Encounters:  11/23/24 233 lb 4 oz (105.8 kg)  11/07/23 227 lb (103 kg)  08/08/23 230 lb 12.8 oz (104.7 kg)      Physical Exam Constitutional:      General: She is not in acute distress.    Appearance: Normal appearance. She is well-developed. She is not ill-appearing or toxic-appearing.  HENT:     Head: Normocephalic.     Right Ear: Hearing, tympanic membrane, ear canal and external ear normal. Tympanic membrane is not erythematous, retracted or bulging.     Left Ear: Hearing, tympanic membrane, ear canal and external ear normal. Tympanic membrane is not  erythematous, retracted or bulging.     Nose: No mucosal edema or rhinorrhea.     Right Sinus: No maxillary sinus tenderness or frontal sinus tenderness.     Left Sinus: No maxillary sinus tenderness or frontal sinus tenderness.     Mouth/Throat:     Pharynx: Uvula midline.  Eyes:     General: Lids are normal. Lids are everted, no foreign bodies appreciated.     Conjunctiva/sclera: Conjunctivae normal.     Pupils: Pupils are equal, round, and reactive to light.  Neck:     Thyroid : No thyroid  mass or thyromegaly.     Vascular: No carotid bruit.     Trachea: Trachea normal.  Cardiovascular:     Rate and Rhythm: Normal rate and regular rhythm.     Pulses: Normal pulses.     Heart sounds: Normal heart sounds, S1 normal and S2 normal. No murmur heard.    No friction rub. No gallop.  Pulmonary:     Effort: Pulmonary effort is normal. No tachypnea or respiratory distress.     Breath sounds: Normal breath sounds. No decreased breath sounds, wheezing, rhonchi or rales.  Abdominal:     General: Bowel sounds are normal.     Palpations: Abdomen is soft.     Tenderness: There is abdominal tenderness in the right lower quadrant. There is no right CVA tenderness, left CVA tenderness, guarding or rebound.  Musculoskeletal:     Cervical back: Normal range of motion and neck supple.  Skin:    General: Skin is warm and dry.     Findings: No rash.  Neurological:     Mental Status: She is alert.  Psychiatric:        Mood and Affect: Mood is not anxious or depressed.        Speech: Speech normal.        Behavior: Behavior normal. Behavior is cooperative.        Thought Content: Thought content normal.        Judgment: Judgment normal.       Results for orders placed or performed in visit on 11/23/24  POCT Urinalysis Dipstick (Automated)   Collection Time: 11/23/24  2:09 PM  Result Value Ref Range   Color, UA Yellow    Clarity, UA Clear    Glucose, UA Negative Negative   Bilirubin, UA  Negative    Ketones, UA Negative    Spec Grav, UA 1.025 1.010 - 1.025   Blood, UA Negative    pH, UA 6.0 5.0 - 8.0   Protein, UA Negative Negative   Urobilinogen, UA 0.2 0.2 or  1.0 E.U./dL   Nitrite, UA Negative    Leukocytes, UA Negative Negative    Assessment and Plan  Urinary frequency Assessment & Plan:  Acute, UA clear.  No known bladder triggers in diet, no changes in diet.  Symptoms most likely related to incomplete emptying from constipation. Encourage bladder trigger avoidance and pushing water to flush bladder.   Orders: -     POCT Urinalysis Dipstick (Automated)  Right lower quadrant pain Assessment & Plan: Acute, improved today Most likely secondary to constipation/gas.  No sign of urinary tract infection.  No red flags for cholecystectomy.  Less likely ovarian cyst. Recommend increasing water intake, MiraLAX daily until bowel movement.  Can also take magnesium daily at bedtime.  If pain continuing after complete bowel movement consider further evaluation such as right lower quadran abdominal ultrasound.  Return and ER precautions provided.     No follow-ups on file.   Greig Ring, MD

## 2024-11-23 NOTE — Assessment & Plan Note (Signed)
 Acute, improved today Most likely secondary to constipation/gas.  No sign of urinary tract infection.  No red flags for cholecystectomy.  Less likely ovarian cyst. Recommend increasing water intake, MiraLAX daily until bowel movement.  Can also take magnesium daily at bedtime.  If pain continuing after complete bowel movement consider further evaluation such as right lower quadran abdominal ultrasound.  Return and ER precautions provided.

## 2024-11-23 NOTE — Assessment & Plan Note (Signed)
 Acute, UA clear.  No known bladder triggers in diet, no changes in diet.  Symptoms most likely related to incomplete emptying from constipation. Encourage bladder trigger avoidance and pushing water to flush bladder.

## 2024-12-05 ENCOUNTER — Other Ambulatory Visit: Payer: Self-pay | Admitting: Nurse Practitioner

## 2024-12-05 DIAGNOSIS — I10 Essential (primary) hypertension: Secondary | ICD-10-CM

## 2024-12-06 NOTE — Telephone Encounter (Signed)
 Patient is overdue for CPE. Please schedule her in the next 30 days to continue getting refills

## 2024-12-06 NOTE — Telephone Encounter (Signed)
 Patient has been scheduled for next available 02/02/25 at 2:40 pm.

## 2024-12-10 ENCOUNTER — Telehealth: Payer: Self-pay

## 2024-12-10 DIAGNOSIS — Z20828 Contact with and (suspected) exposure to other viral communicable diseases: Secondary | ICD-10-CM

## 2024-12-10 MED ORDER — OSELTAMIVIR PHOSPHATE 75 MG PO CAPS: 75.0000 mg | ORAL_CAPSULE | Freq: Every day | ORAL | 0 refills | Status: AC

## 2024-12-10 NOTE — Telephone Encounter (Signed)
 Called patient. She is not having symptoms. Discussed with her about sending in prophylaxis dosing of tamiflu 

## 2024-12-10 NOTE — Addendum Note (Signed)
 Addended by: WENDEE LYNWOOD HERO on: 12/10/2024 04:30 PM   Modules accepted: Orders

## 2024-12-10 NOTE — Telephone Encounter (Signed)
 Copied from CRM #[id number]. Topic: Clinical - Medical Advice >> Dec 10, 2024  3:39 PM Alexandria E wrote: Reason for CRM: Patient's mother tested positive for the flu, patient requesting a prescription of Tamiflu  sent in to the [facility] on [address].

## 2025-02-02 ENCOUNTER — Encounter: Admitting: Nurse Practitioner
